# Patient Record
Sex: Male | Born: 1945 | Race: White | Hispanic: Yes | Marital: Married | Smoking: Former smoker
Health system: Southern US, Community
[De-identification: ages and names within clinical notes are randomized; demographics above are authoritative.]

## PROBLEM LIST (undated history)

## (undated) DIAGNOSIS — I480 Paroxysmal atrial fibrillation: Secondary | ICD-10-CM

## (undated) DIAGNOSIS — I4719 Other supraventricular tachycardia: Secondary | ICD-10-CM

## (undated) DIAGNOSIS — I471 Supraventricular tachycardia: Secondary | ICD-10-CM

## (undated) DIAGNOSIS — R001 Bradycardia, unspecified: Secondary | ICD-10-CM

## (undated) DIAGNOSIS — I453 Trifascicular block: Secondary | ICD-10-CM

## (undated) DIAGNOSIS — C61 Malignant neoplasm of prostate: Secondary | ICD-10-CM

## (undated) HISTORY — DX: Trifascicular block: I45.3

## (undated) HISTORY — DX: Bradycardia, unspecified: R00.1

## (undated) HISTORY — DX: Other supraventricular tachycardia: I47.19

## (undated) HISTORY — DX: Supraventricular tachycardia: I47.1

## (undated) HISTORY — PX: DOBUTAMINE STRESS ECHO: SHX5426

## (undated) HISTORY — PX: ELECTROPHYSIOLOGIC STUDY: SHX172A

## (undated) HISTORY — DX: Paroxysmal atrial fibrillation: I48.0

---

## 2006-08-02 ENCOUNTER — Encounter: Payer: Self-pay | Admitting: Internal Medicine

## 2006-08-02 ENCOUNTER — Ambulatory Visit (HOSPITAL_COMMUNITY): Admission: RE | Admit: 2006-08-02 | Discharge: 2006-08-02 | Payer: Self-pay | Admitting: Internal Medicine

## 2007-10-25 ENCOUNTER — Encounter: Payer: Self-pay | Admitting: Internal Medicine

## 2008-11-10 ENCOUNTER — Encounter: Payer: Self-pay | Admitting: Internal Medicine

## 2010-11-11 ENCOUNTER — Ambulatory Visit (HOSPITAL_BASED_OUTPATIENT_CLINIC_OR_DEPARTMENT_OTHER)
Admission: RE | Admit: 2010-11-11 | Discharge: 2010-11-11 | Disposition: A | Discharge status: Home / Self Care | Payer: BC Managed Care – HMO | Source: Ambulatory Visit | Attending: Orthopedic Surgery | Admitting: Orthopedic Surgery

## 2010-11-11 ENCOUNTER — Ambulatory Visit (HOSPITAL_BASED_OUTPATIENT_CLINIC_OR_DEPARTMENT_OTHER): Admit: 2010-11-11 | Payer: Self-pay | Admitting: Orthopedic Surgery

## 2010-11-11 DIAGNOSIS — S42033A Displaced fracture of lateral end of unspecified clavicle, initial encounter for closed fracture: Secondary | ICD-10-CM | POA: Insufficient documentation

## 2010-11-11 DIAGNOSIS — X58XXXA Exposure to other specified factors, initial encounter: Secondary | ICD-10-CM | POA: Insufficient documentation

## 2010-11-17 NOTE — Op Note (Signed)
NAMEWINFIELD, Kevin Castro NO.:  [id number]  MEDICAL RECORD NO.:  [id number]  LOCATION:                                 FACILITY:  PHYSICIAN:  Loreta Ave, M.D. DATE OF BIRTH:  November 15, 1945  DATE OF PROCEDURE:  11/11/2010 DATE OF DISCHARGE:                              OPERATIVE REPORT   PREOPERATIVE DIAGNOSIS:  Left shoulder type 2 distal clavicle fracture with displacement and complete disruption of coracoacromial ligaments.  POSTOPERATIVE DIAGNOSIS:  Left shoulder type 2 distal clavicle fracture with displacement and complete disruption of coracoacromial ligaments. Also with some underlying previously asymptomatic degenerative arthritis, AC joint.  PROCEDURE:  Open intervention left shoulder with repair of shoulder separation utilizing FiberWire #5 x2.  Also a soft tissue allograft and superior metallic button.  Excision distal clavicle at fracture site.  SURGEON:  Loreta Ave, MD  ASSISTANT:  Genene Churn. Barry Dienes, PA present throughout the entire case and necessary for timely completion of procedure.  ANESTHESIA:  General.  BLOOD LOSS:  Minimal.  SPECIMENS:  None.  CULTURES:  None.  COMPLICATION:  None.  DRESSINGS:  Soft compressive with sling.  PROCEDURE:  The patient was brought to operating room, placed on the operating table in supine position.  After adequate anesthesia had been obtained, placed in beach chair position on the shoulder positioner, prepped and draped in usual sterile fashion.  Saber incision over the fracture site down towards coracoid.  Skin and subcutaneous tissue divided.  Fracture medially evident, markedly displaced.  Deltopectoral fascia taken down off front of the clavicle.  Distal fragment exposed. Fluoroscopic guidance throughout.  Distal fragment had no healing at all, was excised in its entirety.  The interval below the clavicle was then explored with blunt dissection exposing the coracoid and the CC ligament  torn off the bottom of the clavicle.  I went carefully under the coracoid bringing two #5 FiberWire sutures as well as a prepared allograft which was tagged with FiberWire at either end.  These were pulled out on either side.  A drill hole was made in the clavicle directly above that with a 5.5 mm drill.  All of the suture and the graft were pulled through the drill hole and then through a round metallic button, it was placed on the top of the clavicle.  FiberWire were brought through the small holes in a button and the graft up through the central larger hole.  Wound irrigated.  I then reduced the separation anatomically and sutured the two #5 FiberWire's on either side of the button reducing and holding this in good anatomic position, confirmed visually and fluoroscopically.  The graft was then tensioned up through the middle of the button and fixed there with a 10 mm interference screw.  Cut off smooth.  Overall construct had excellent stability. Anatomic alignment confirmed visually and fluoroscopically. Wound irrigated.  Deltopectoral fascia closed down over top of the defect.  Incision closed with subcutaneous and subcuticular Vicryl. Sterile compressive dressing applied.  Sling applied.  Anesthesia reversed.  Brought to the recovery room.  Tolerated surgery well.  No complications.     Loreta Ave, M.D.     DFM/MEDQ  D:  11/11/2010  T:  11/12/2010  Job:  [id number]  Electronically Signed by Mckinley Jewel M.D. on 11/17/2010 02:04:37 PM

## 2010-12-20 DIAGNOSIS — I471 Supraventricular tachycardia: Secondary | ICD-10-CM | POA: Insufficient documentation

## 2011-01-14 ENCOUNTER — Encounter (HOSPITAL_BASED_OUTPATIENT_CLINIC_OR_DEPARTMENT_OTHER): Payer: Self-pay

## 2012-04-19 ENCOUNTER — Encounter: Payer: Self-pay | Admitting: Cardiology

## 2012-04-27 ENCOUNTER — Ambulatory Visit: Payer: BC Managed Care – HMO | Admitting: Cardiology

## 2013-06-18 ENCOUNTER — Ambulatory Visit (HOSPITAL_COMMUNITY): Payer: Self-pay

## 2013-06-18 ENCOUNTER — Encounter (HOSPITAL_COMMUNITY): Payer: Self-pay

## 2013-06-18 ENCOUNTER — Ambulatory Visit (HOSPITAL_COMMUNITY)
Admission: RE | Admit: 2013-06-18 | Discharge: 2013-06-18 | Disposition: A | Discharge status: Home / Self Care | Payer: BC Managed Care – PPO | Source: Ambulatory Visit | Attending: Internal Medicine | Admitting: Internal Medicine

## 2013-06-18 VITALS — BP 112/52 | HR 47 | Wt 188.1 lb

## 2013-06-18 DIAGNOSIS — I453 Trifascicular block: Secondary | ICD-10-CM | POA: Insufficient documentation

## 2013-06-18 DIAGNOSIS — R002 Palpitations: Secondary | ICD-10-CM

## 2013-06-18 DIAGNOSIS — I471 Supraventricular tachycardia, unspecified: Secondary | ICD-10-CM | POA: Insufficient documentation

## 2013-06-18 NOTE — Progress Notes (Signed)
Patient ID: Kevin Castro, male   DOB: [birth year], 68 y.o.   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  Denies any significant PMHx.   In 2008 was having exercise-related palpitations and saw Dr. Pauline Aus. Had 24-hour Holter which showed --Rare unifocal PVCs --Frequent atrial ectopic beats with many short bursts of SVT. No sustained events.   Then went to see [name] at [facility] and had EP study. He was non-inducible with isuprel and another agent. He was offered ablation but refused.   November 2011 Echo: EF 59% Mildly dilated LV/RV            AoV sclerosis            Biatrial enlargement (LA 46 mm)            Mild TR/PR  Stress echo 12/2010:           ETT. 16.8 METs (estimated pVO2 = 59)           Short bursts SVT and PVCs           No ischemia  Has been cycling very actively for ~ 13 years. Runs trail marathons during winter. Over past few years doing very well minimal palpitations - on no meds. Last year, was cycling in mountains developed 15 min episode of palpitations - HR spiked up into 180-220 range and sustained. He finally had to get off bike and it abated. Was ok until last Thursday. About 45 mins into ride HR shot up too 210-220 and sustained until he slowed down. Felt dizzy and marked palpitations. Rode 70 miles to [employer] on Saturday and was fine. Sunday had hard ride and had recurrent tachypalpitations on/off 20 mins. No symptoms at rest or with casual activity. Wife says he snores mildly. Drinks 2-3 Diet cokes per day. + tea.    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 No FHx of SCD.    Review of Systems:     Cardiac Review of Systems: {Y] = yes [ ]  = no  Chest Pain [    ]  Resting SOB [   ] Exertional SOB  [  ]  Orthopnea [  ]   Pedal Edema [   ]    Palpitations Blue.Reese   ] Syncope  [  ]   Presyncope [   ]  General Review of Systems: [Y] = yes [  ]=no Constitional: recent weight change [  ]; anorexia [  ]; fatigue [  ]; nausea [  ]; night sweats [  ]; fever [  ]; or chills [  ];  Eye : blurred vision [  ]; diplopia [   ]; vision changes [  ];  Amaurosis fugax[  ]; Resp: cough [  ];  wheezing[  ];  hemoptysis[  ]; shortness of breath[  ]; paroxysmal nocturnal dyspnea[  ]; dyspnea on exertion[  ]; or orthopnea[  ];  GI:  gallstones[  ], vomiting[  ];  dysphagia[  ]; melena[  ];  hematochezia [  ]; heartburn[  ];    GU: kidney stones [  ]; hematuria[  ];   dysuria [  ];  nocturia[  ];  history of     obstruction [  ];                 Skin: rash, swelling[  ];, hair loss[  ];  peripheral edema[  ];  or itching[  ]; Musculosketetal: myalgias[  ];  joint swelling[  ];  joint erythema[  ];  joint pain[  ];  back pain[  ];  Heme/Lymph: bruising[  ];  bleeding[  ];  anemia[  ];  Neuro: TIA[  ];  headaches[  ];  stroke[  ];  vertigo[  ];  seizures[  ];   paresthesias[  ];  difficulty walking[  ];  Psych:depression[  ]; anxiety[  ];  Endocrine: diabetes[  ];  thyroid dysfunction[  ];  Other:    No past medical history on file.  Current Outpatient Prescriptions  Medication Sig Dispense Refill  . Glucosamine-Chondroit-Vit C-Mn (GLUCOSAMINE 1500 COMPLEX) CAPS Take 1,500 each by mouth daily.      . Multiple Vitamins-Minerals (MULTIVITAMIN WITH MINERALS) tablet Take 1 tablet by mouth daily.      . Omega-3 Fatty Acids (FISH OIL) 1000 MG CAPS Take 2,000 mg by mouth.       No current facility-administered medications for this encounter.     Allergies not on file  History   Social History  . Marital Status: Married    Spouse Name: N/A    Number of Children: N/A  . Years of Education: N/A   Occupational History  . Not on file.    Social History Main Topics  . Smoking status: Never Smoker   . Smokeless tobacco: Not on file  . Alcohol Use: Not on file  . Drug Use: Not on file  . Sexual Activity: Not on file   Other Topics Concern  . Not on file   Social History Narrative  . No narrative on file    FHX: Reports family h/o heart arrhythmias but not lethal. No FHx of SCD or premature CAD   PHYSICAL EXAM: Filed Vitals:   06/18/13 1126  BP: 112/52  Pulse: 47   General:  Well appearing. No respiratory difficulty HEENT: normal Neck: supple. no JVD. Carotids 2+ bilat; no bruits. No lymphadenopathy or thryomegaly appreciated. Cor: PMI nondisplaced. Loletha Grayer. regular No rubs, gallops or murmurs. Lungs: clear Abdomen: soft, nontender, nondistended. No hepatosplenomegaly. No bruits or masses. Good bowel sounds. Extremities: no cyanosis, clubbing, rash, edema Neuro: alert & oriented x 3, cranial nerves grossly intact. moves all 4 extremities w/o difficulty. Affect pleasant.   No results found for this or any previous visit (from the past 24 hour(s)). No results found.   ASSESSMENT & PLAN: 1. 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 I also wonder if they may be more associated with early season riding and get better as he gets further  into the season. I discussed this with Dr. Caryl Comes who agrees and brought up the difference between re-entrant and automatic rhythms. We discussed various ways of detection including:  1) Exercise testing 2) Event monitor  3) AliveCor (he only has iPhone 6) 4) Linq monitoring  At this point will wait to see if he has another episode. If so, will refer to Dr. Caryl Comes for [place] implantation. He has not had syncope or near syncope so I feel it is safe to continue cycling. Will check echo.   Shaune Pascal Bensimhon,MD 1:03 PM

## 2013-06-18 NOTE — Addendum Note (Signed)
Encounter addended by: Scarlette Calico, RN on: 06/18/2013  1:08 PM<BR>     Documentation filed: Orders, Patient Instructions Section

## 2013-06-18 NOTE — Patient Instructions (Signed)
Your physician has requested that you have an echocardiogram. Echocardiography is a painless test that uses sound waves to create images of your heart. It provides your doctor with information about the size and shape of your heart and how well your heart's chambers and valves are working. This procedure takes approximately one hour. There are no restrictions for this procedure.   

## 2013-06-25 ENCOUNTER — Ambulatory Visit (HOSPITAL_COMMUNITY)
Admission: RE | Admit: 2013-06-25 | Discharge: 2013-06-25 | Disposition: A | Discharge status: Home / Self Care | Payer: BC Managed Care – PPO | Source: Ambulatory Visit | Attending: Internal Medicine | Admitting: Internal Medicine

## 2013-06-25 DIAGNOSIS — R002 Palpitations: Secondary | ICD-10-CM

## 2013-06-25 DIAGNOSIS — I059 Rheumatic mitral valve disease, unspecified: Secondary | ICD-10-CM

## 2014-01-02 ENCOUNTER — Ambulatory Visit (HOSPITAL_COMMUNITY)
Admission: RE | Admit: 2014-01-02 | Discharge: 2014-01-02 | Disposition: A | Discharge status: Home / Self Care | Payer: BC Managed Care – PPO | Source: Ambulatory Visit | Attending: Cardiology | Admitting: Cardiology

## 2014-01-02 VITALS — BP 122/60 | HR 52 | Wt 188.8 lb

## 2014-01-02 DIAGNOSIS — I471 Supraventricular tachycardia, unspecified: Secondary | ICD-10-CM

## 2014-01-02 DIAGNOSIS — I453 Trifascicular block: Secondary | ICD-10-CM | POA: Diagnosis not present

## 2014-01-02 DIAGNOSIS — I4510 Unspecified right bundle-branch block: Secondary | ICD-10-CM | POA: Diagnosis not present

## 2014-01-02 DIAGNOSIS — R002 Palpitations: Secondary | ICD-10-CM | POA: Diagnosis present

## 2014-01-02 NOTE — Addendum Note (Signed)
Encounter addended by: Kerry Dory, CMA on: 01/02/2014  3:01 PM<BR>     Documentation filed: Orders, Dx Association

## 2014-01-02 NOTE — Addendum Note (Signed)
Encounter addended by: Scarlette Calico, RN on: 01/02/2014  2:18 PM<BR>     Documentation filed: Dx Association, Patient Instructions Section, Orders

## 2014-01-02 NOTE — Progress Notes (Signed)
Patient ID: Kevin Castro, male   DOB: 09-22-1945, 68 y.o.   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  Denies any significant PMHx.   In 2008 was having exercise-related palpitations and saw Dr. Pauline Aus. Had 24-hour Holter which showed --Rare unifocal PVCs --Frequent atrial ectopic beats with many short bursts of SVT. No sustained events.   Then went to see [name] at [facility] and had EP study. He was non-inducible with isuprel and another agent. He was offered ablation but refused.   In 12/14 had prolonged symptomatic episode of tachypalpitations which he did not seek attention for.   November 2011 Echo: EF 59% Mildly dilated LV/RV            AoV sclerosis            Biatrial enlargement (LA 46 mm)            Mild TR/PR  Stress echo 12/2010:            ETT. 16.8 METs (estimated pVO2 = 59)           Short bursts SVT and PVCs           No ischemia  Echo 5/15: EF 55-60% No RWMA  Follow-up: Since I last saw him he had episode of irregular tachypalpitations that lasted for several hours which were quite symptomatic. He went to Urgent Care and nurse took his pulse and confirmed a fast and irregular pulse but by the time he was hooked up for an ECG it converted. Now has AliveCor device. Has not had recurrent episodes but does have one download on AliveCor which suggests an atrial tachycardia or AFL on 11/27 with controlled ventricular rate. Continues to be quite active without recurrent symptoms.    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 No FHx of SCD.     Current Outpatient Prescriptions  Medication Sig Dispense Refill  . Glucosamine-Chondroit-Vit C-Mn (GLUCOSAMINE 1500 COMPLEX) CAPS Take 1,500 each by mouth daily.    . Multiple Vitamins-Minerals  (MULTIVITAMIN WITH MINERALS) tablet Take 1 tablet by mouth daily.    . Omega-3 Fatty Acids (FISH OIL) 1000 MG CAPS Take 2,000 mg by mouth.     No current facility-administered medications for this encounter.     Not on File  History   Social History  . Marital Status: Married    Spouse Name: N/A    Number of Children: N/A  . Years of Education: N/A   Occupational History  . Not on file.   Social History Main Topics  . Smoking status: Never Smoker   . Smokeless tobacco: Not on file  . Alcohol Use: Not on file  . Drug Use: Not on file  . Sexual Activity: Not on file   Other Topics Concern  . Not on file   Social History Narrative  . No narrative on file    FHX: Reports family h/o heart arrhythmias but not lethal. No FHx of SCD or premature CAD   PHYSICAL EXAM: Filed Vitals:   01/02/14 1340  BP: 122/60  Pulse: 52   General:  Well appearing. No respiratory difficulty HEENT: normal Neck: supple. no JVD. Carotids 2+ bilat; no bruits. No lymphadenopathy or thryomegaly appreciated. Cor: PMI nondisplaced. Loletha Grayer. regular No rubs, gallops or  murmurs. Lungs: clear Abdomen: soft, nontender, nondistended. No hepatosplenomegaly. No bruits or masses. Good bowel sounds. Extremities: no cyanosis, clubbing, rash, edema Neuro: alert & oriented x 3, cranial nerves grossly intact. moves all 4 extremities w/o difficulty. Affect pleasant.   No results found for this or any previous visit (from the past 24 hour(s)). No results found.   ASSESSMENT & PLAN: 1. Tachypalpitations - by transmission on his AliveCor this is PSVT - probably atrial tach of AFL though this does not preclude possible episodes of AF. At this point they are infrequent but quite symptomatic. Would continue watchful waiting and if become more frequent would likely need to consider. I will have him see Jolyn Nap for formal EP evaluation and get his opinion as well. Discussed use of vagal maneuvers. No need for  anticoagulation at this time. 2. Trifascicular block 3. Preventive cardiology - he is interested in calcium scoring to help assess cardiac risk. We will schedule this.    Yazeed Pryer,MD 1:19 PM

## 2014-01-02 NOTE — Patient Instructions (Signed)
Calcium Scoring CT scan  You have been referred to Dr Caryl Comes  Follow up as needed

## 2014-01-08 ENCOUNTER — Ambulatory Visit (INDEPENDENT_AMBULATORY_CARE_PROVIDER_SITE_OTHER)
Admission: RE | Admit: 2014-01-08 | Discharge: 2014-01-08 | Disposition: A | Discharge status: Home / Self Care | Payer: Self-pay | Source: Ambulatory Visit | Attending: Internal Medicine | Admitting: Internal Medicine

## 2014-01-08 DIAGNOSIS — I471 Supraventricular tachycardia: Secondary | ICD-10-CM

## 2014-01-29 ENCOUNTER — Encounter: Payer: Self-pay | Admitting: *Deleted

## 2014-01-29 ENCOUNTER — Telehealth: Payer: Self-pay | Admitting: *Deleted

## 2014-01-29 NOTE — Telephone Encounter (Signed)
LVM at [facility] that i was looking for a holter for this pt, no return call... 1st request..

## 2014-02-04 NOTE — Telephone Encounter (Signed)
Spoke to [employer], do not have the 2008 holter.... Pt has alivecore.Marland KitchenMarland Kitchen

## 2014-02-06 ENCOUNTER — Encounter: Payer: Self-pay | Admitting: Internal Medicine

## 2014-02-06 ENCOUNTER — Ambulatory Visit (INDEPENDENT_AMBULATORY_CARE_PROVIDER_SITE_OTHER): Payer: 59 | Admitting: Internal Medicine

## 2014-02-06 VITALS — BP 130/62 | HR 42 | Ht 70.5 in | Wt 183.8 lb

## 2014-02-06 DIAGNOSIS — R002 Palpitations: Secondary | ICD-10-CM

## 2014-02-06 NOTE — Patient Instructions (Signed)
Your physician recommends that you continue on your current medications as directed. Please refer to the Current Medication list given to you today.  Your physician wants you to follow-up in: 6 months with Dr. Klein. You will receive a reminder letter in the mail two months in advance. If you don't receive a letter, please call our office to schedule the follow-up appointment.  

## 2014-02-06 NOTE — Progress Notes (Signed)
ELECTROPHYSIOLOGY CONSULT NOTE  Patient ID: Kevin Castro, MRN: [id number], DOB/AGE: Mar 31, 1945 69 y.o. Admit date: (Not on file) Date of Consult: 02/06/2014  Primary Physician: No primary care provider on file. Primary Cardiologist: db  Chief Complaint: atrial tach   HPI Kevin Castro is a 69 y.o. male  Referred because of a history of atrial tachycardia.  He is an avid bicycle rider and began having spells while at high performance where he be having difficulty breathing associated with palpitations. He was referred to Dr. Rosita Fire at [facility] who identified an atrial tachycardia. EP testing apparently was noninducible. A catheter ablation procedure was discussed but not undertaken. (?).  He has had increasing problems with exercise associated palpitations that he has had 2 episodes that have been associated with syncope/presyncope. The first occurred while at high performance. Heart rate on his monitor went to about 200 bpm and persisted despite decreasing work effort.  He got off his bicycle and passed out  His HR graduallly came down  The other episode had occurred about a year before. He had run in [place] and was in his car driving home when again he had tachycardia and at this time presyncope.    Echocardiogram demonstrated moderate LAE mild RVE and RAE   Past Medical History  Diagnosis Date  . Rapid palpitations     tachy  . Trifascicular block       Surgical History:  Past Surgical History  Procedure Laterality Date  . Electrophysiologic study    . Dobutamine stress echo       Home Meds: Prior to Admission medications   Medication Sig Start Date End Date Taking? Authorizing Provider  Glucosamine-Chondroit-Vit C-Mn (GLUCOSAMINE 1500 COMPLEX) CAPS Take 1,500 each by mouth daily.   Yes Historical Provider, MD  Multiple Vitamins-Minerals (MULTIVITAMIN WITH MINERALS) tablet Take 1 tablet by mouth daily.   Yes Historical Provider, MD  Omega-3 Fatty Acids (FISH  OIL) 1000 MG CAPS Take 2,000 mg by mouth.   Yes Historical Provider, MD       Allergies:  Allergies  Allergen Reactions  . Penicillins     History   Social History  . Marital Status: Married    Spouse Name: N/A    Number of Children: N/A  . Years of Education: N/A   Occupational History  . Not on file.   Social History Main Topics  . Smoking status: Never Smoker   . Smokeless tobacco: Not on file  . Alcohol Use: Not on file  . Drug Use: Not on file  . Sexual Activity: Not on file   Other Topics Concern  . Not on file   Social History Narrative     Family History  Problem Relation Age of Onset  . Arrhythmia Mother   . Lung cancer Father   . Liver disease Father   . Arrhythmia Sister   . Arrhythmia Brother   . Diabetes Brother     oldest brother     ROS:  Please see the history of present illness.     All other systems reviewed and negative.    Physical Exam: Blood pressure 130/62, pulse 42, height 5' 10.5" (1.791 m), weight 183 lb 12.8 oz (83.371 kg). General: Well developed, well nourished male in no acute distress. Head: Normocephalic, atraumatic, sclera non-icteric, no xanthomas, nares are without discharge. EENT: normal Lymph Nodes:  none Back: without scoliosis/kyphosis, no CVA tendersness Neck: Negative for carotid bruits. JVD not elevated. Lungs: Clear bilaterally to auscultation  without wheezes, rales, or rhonchi. Breathing is unlabored. Heart: R but slow RR with S1 S2.  2/6 systolic murmur , rubs, or gallops appreciated. Abdomen: Soft, non-tender, non-distended with normoactive bowel sounds. No hepatomegaly. No rebound/guarding. No obvious abdominal masses. Msk:  Strength and tone appear normal for age. Extremities: No clubbing or cyanosis. No  edema.  Distal pedal pulses are 2+ and equal bilaterally. Skin: Warm and Dry Neuro: Alert and oriented X 3. CN III-XII intact Grossly normal sensory and motor function . Psych:  Responds to questions  appropriately with a normal affect.      Labs: Cardiac Enzymes No results for input(s): CKTOTAL, CKMB, TROPONINI in the last 72 hours. CBC No results found for: WBC, HGB, HCT, MCV, PLT PROTIME: No results for input(s): LABPROT, INR in the last 72 hours. Chemistry No results for input(s): NA, K, CL, CO2, BUN, CREATININE, CALCIUM, PROT, BILITOT, ALKPHOS, ALT, AST, GLUCOSE in the last 168 hours.  Invalid input(s): LABALBU Lipids No results found for: CHOL, HDL, LDLCALC, TRIG BNP No results found for: PROBNP Miscellaneous No results found for: DDIMER  Radiology/Studies:  Ct Cardiac Scoring  01/08/2014   ADDENDUM REPORT: 01/08/2014 17:20  CLINICAL DATA:  Risk stratification  EXAM: Coronary Calcium Score  TECHNIQUE: The patient was scanned on a Siemens Sensation 16 slice scanner. Axial non-contrast 3 mm slices were carried out through the heart. The data set was analyzed on a dedicated work station and scored using the [place].  FINDINGS: Non-cardiac: See separate report from [facility].  Ascending Aorta:  Normal caliber  Pericardium:  Normal, no effusion.  Coronary arteries:  Normal origin.  IMPRESSION: Coronary calcium score of 0. This was 0 percentile for age and sex matched control.  Ena Dawley   Electronically Signed   By: Ena Dawley   On: 01/08/2014 17:20   01/08/2014   EXAM: OVER-READ INTERPRETATION  CT CHEST  The following report is an over-read performed by radiologist Dr. Rebekah Chesterfield [facility] on 01/08/2014. This over-read does not include interpretation of cardiac or coronary anatomy or pathology. The coronary calcium score/coronary CTA interpretation by the cardiologist is attached.  COMPARISON:  No priors.  FINDINGS: Within the visualized portions of the thorax there is no suspicious appearing pulmonary nodule or mass, no acute consolidative airspace disease, no pneumothorax, no pleural effusion and no lymphadenopathy. Visualized portions  of the upper abdomen are unremarkable. There are no aggressive appearing lytic or blastic lesions noted in the visualized portions of the skeleton.  IMPRESSION: 1. No significant incidental noncardiac findings noted.  Electronically Signed: By: Vinnie Langton M.D. On: 01/08/2014 16:49    EKG: Sinus rhythm at 42 Intervals 21/14/50 Axis left at -47  Assessment and Plan:   Trifascicular block  Atrial tachycardia  Syncope/presyncope  Left atrial dilatation and RV chamber enlargement    We discussed treatment options of atrial tachycardia including doing nothing, medication, or catheter ablation although the first procedure was challenging because of the lack of inducibility At this point he would like to continue as he is  The episodes of syncope were associated with persisting tachycardia and he, while concerned, about it is not sufficiently concerned to justify undertaking invasive procedure.  We did discuss the importance of recumbency to mitigate hypotension. I suspect that the tachycardia is activating vasomotor reflexes in the context of exercise and post exercise vasodilatation  We also discussed the implications of protracted exercise potentially be responsible for the chamber enlargement that is seen. I told  him I would discuss with Dr. Reine Just  I suggested we undertake cardiac MR to further quantitate structural abnormalities    Virl Axe

## 2014-08-05 ENCOUNTER — Encounter: Payer: Self-pay | Admitting: Internal Medicine

## 2014-08-05 ENCOUNTER — Ambulatory Visit (INDEPENDENT_AMBULATORY_CARE_PROVIDER_SITE_OTHER): Payer: 59 | Admitting: Internal Medicine

## 2014-08-05 VITALS — BP 116/68 | HR 45 | Ht 70.0 in | Wt 186.8 lb

## 2014-08-05 DIAGNOSIS — I471 Supraventricular tachycardia: Secondary | ICD-10-CM | POA: Diagnosis not present

## 2014-08-05 NOTE — Progress Notes (Signed)
ELECTROPHYSIOLOGY CONSULT NOTE  Patient ID: Kevin Castro, MRN: [id number], DOB/AGE: 1945-11-03 69 y.o. Admit date: (Not on file) Date of Consult: 08/05/2014  Primary Physician: Horatio Pel, MD Primary Cardiologist: db  Chief Complaint: atrial tach   HPI Kevin Castro is a 69 y.o. male  Referred because of a history of atrial tachycardia.  He had recurrent event HR 120 x 24 hrs and using alivecor>>afib     Past Medical History  Diagnosis Date  . Rapid palpitations     tachy  . Trifascicular block       Surgical History:  Past Surgical History  Procedure Laterality Date  . Electrophysiologic study    . Dobutamine stress echo       Home Meds: Prior to Admission medications   Medication Sig Start Date End Date Taking? Authorizing Provider  Glucosamine-Chondroit-Vit C-Mn (GLUCOSAMINE 1500 COMPLEX) CAPS Take 1,500 each by mouth daily.   Yes Historical Provider, MD  Multiple Vitamins-Minerals (MULTIVITAMIN WITH MINERALS) tablet Take 1 tablet by mouth daily.   Yes Historical Provider, MD  Omega-3 Fatty Acids (FISH OIL) 1000 MG CAPS Take 2,000 mg by mouth.   Yes Historical Provider, MD       Allergies:  Allergies  Allergen Reactions  . Penicillins Rash    Rash/blisters    History   Social History  . Marital Status: Married    Spouse Name: N/A  . Number of Children: N/A  . Years of Education: N/A   Occupational History  . Not on file.   Social History Main Topics  . Smoking status: Never Smoker   . Smokeless tobacco: Not on file  . Alcohol Use: Not on file  . Drug Use: Not on file  . Sexual Activity: Not on file   Other Topics Concern  . Not on file   Social History Narrative     Family History  Problem Relation Age of Onset  . Arrhythmia Mother   . Lung cancer Father   . Liver disease Father   . Arrhythmia Sister   . Arrhythmia Brother   . Diabetes Brother     oldest brother     ROS:  Please see the history of present illness.      All other systems reviewed and negative.    Physical Exam: Blood pressure 116/68, pulse 45, height 5\' 10"  (1.778 m), weight 186 lb 12.8 oz (84.732 kg). General: Well developed, well nourished male in no acute distress. Head: Normocephalic, atraumatic, sclera non-icteric, no xanthomas, nares are without discharge. EENT: normal   Back: without scoliosis/kyphosis, no CVA tendersness Neck: Negative for carotid bruits. JVD not elevated. L  Heart: R but slow RR     Msk:  Strength and tone appear normal for age. Extremities: No clubbing or cyanosis. No  edema.  Distal pedal pulses are 2+ and equal bilaterally. Skin: Warm and Dry Neuro: Alert and oriented X 3. CN III-XII intact Grossly normal sensory and motor function . Psych:  Responds to questions appropriately with a normal affect.      Labs: Cardiac Enzymes No results for input(s): CKTOTAL, CKMB, TROPONINI in the last 72 hours. CBC No results found for: WBC, HGB, HCT, MCV, PLT PROTIME: No results for input(s): LABPROT, INR in the last 72 hours. Chemistry No results for input(s): NA, K, CL, CO2, BUN, CREATININE, CALCIUM, PROT, BILITOT, ALKPHOS, ALT, AST, GLUCOSE in the last 168 hours.  Invalid input(s): LABALBU Lipids No results found for: CHOL, HDL, LDLCALC, TRIG BNP No results  found for: PROBNP Miscellaneous No results found for: DDIMER  Radiology/Studies:  No results found.  EKG: Sinus rhythm at 42 Intervals 21/14/50 Axis left at -47  Assessment and Plan:   Trifascicular block  Atrial tachycardia  Atrial fibrillation  Sinus bradycardia  Syncope/presyncope  Left atrial dilatation and RV chamber enlargement  The ALiveCor demonstrated Afib with RVR   We discussed the possibility of catheter ablation and he would like to discuss with ablationist as primary therapy so as to allow him to continue to exercise  We discussed anticoagulation and with CHADSVASC 1 for age i am borderline in favor of recommending  NOAC, but as he is rider would hold off. Would not use ASA He understands would need anticoagulation for PVI  We spent more than 50% of our >25 min visit in face to face counseling regarding the above        Virl Axe

## 2014-08-05 NOTE — Patient Instructions (Signed)
Medication Instructions:  Your physician recommends that you continue on your current medications as directed. Please refer to the Current Medication list given to you today.  Labwork: No new orders.   Testing/Procedures: No new orders.   Follow-Up: Your physician recommends that you schedule a follow-up appointment as needed with Dr Caryl Comes.    Any Other Special Instructions Will Be Listed Below (If Applicable).

## 2014-10-06 ENCOUNTER — Encounter (HOSPITAL_COMMUNITY): Payer: Self-pay | Admitting: Emergency Medicine

## 2014-10-06 ENCOUNTER — Emergency Department (HOSPITAL_COMMUNITY): Payer: 59

## 2014-10-06 ENCOUNTER — Emergency Department (HOSPITAL_COMMUNITY)
Admission: EM | Admit: 2014-10-06 | Discharge: 2014-10-06 | Disposition: A | Discharge status: Home / Self Care | Payer: 59 | Attending: Emergency Medicine | Admitting: Emergency Medicine

## 2014-10-06 DIAGNOSIS — S3991XA Unspecified injury of abdomen, initial encounter: Secondary | ICD-10-CM | POA: Diagnosis not present

## 2014-10-06 DIAGNOSIS — S70312A Abrasion, left thigh, initial encounter: Secondary | ICD-10-CM | POA: Diagnosis not present

## 2014-10-06 DIAGNOSIS — Y9389 Activity, other specified: Secondary | ICD-10-CM | POA: Insufficient documentation

## 2014-10-06 DIAGNOSIS — Y92410 Unspecified street and highway as the place of occurrence of the external cause: Secondary | ICD-10-CM | POA: Insufficient documentation

## 2014-10-06 DIAGNOSIS — T148XXA Other injury of unspecified body region, initial encounter: Secondary | ICD-10-CM

## 2014-10-06 DIAGNOSIS — S40812A Abrasion of left upper arm, initial encounter: Secondary | ICD-10-CM | POA: Insufficient documentation

## 2014-10-06 DIAGNOSIS — S2232XA Fracture of one rib, left side, initial encounter for closed fracture: Secondary | ICD-10-CM | POA: Diagnosis not present

## 2014-10-06 DIAGNOSIS — Z880 Allergy status to penicillin: Secondary | ICD-10-CM | POA: Insufficient documentation

## 2014-10-06 DIAGNOSIS — S80212A Abrasion, left knee, initial encounter: Secondary | ICD-10-CM | POA: Diagnosis not present

## 2014-10-06 DIAGNOSIS — Y998 Other external cause status: Secondary | ICD-10-CM | POA: Diagnosis not present

## 2014-10-06 DIAGNOSIS — Z79899 Other long term (current) drug therapy: Secondary | ICD-10-CM | POA: Diagnosis not present

## 2014-10-06 DIAGNOSIS — S29001A Unspecified injury of muscle and tendon of front wall of thorax, initial encounter: Secondary | ICD-10-CM | POA: Diagnosis present

## 2014-10-06 MED ORDER — HYDROCODONE-ACETAMINOPHEN 5-325 MG PO TABS: 1.0000 | ORAL_TABLET | ORAL | Status: DC | PRN

## 2014-10-06 MED ORDER — HYDROCODONE-ACETAMINOPHEN 5-325 MG PO TABS
1.0000 | ORAL_TABLET | Freq: Once | ORAL | Status: AC
  Administered 2014-10-06: 1 via ORAL
  Filled 2014-10-06: qty 1

## 2014-10-06 MED ORDER — HYDROMORPHONE HCL 1 MG/ML IJ SOLN
1.0000 mg | Freq: Once | INTRAMUSCULAR | Status: AC
  Administered 2014-10-06: 1 mg via INTRAMUSCULAR
  Filled 2014-10-06: qty 1

## 2014-10-06 MED ORDER — IBUPROFEN 400 MG PO TABS
600.0000 mg | ORAL_TABLET | Freq: Once | ORAL | Status: AC
  Administered 2014-10-06: 600 mg via ORAL
  Filled 2014-10-06 (×2): qty 1

## 2014-10-06 NOTE — ED Notes (Signed)
Rn cleansed and dressed pt's wounds/ applied bacitracin and nonadherent dressings. Pt tolerated well.
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
## 2014-10-06 NOTE — Discharge Instructions (Signed)
Keep wounds clean, apply topical antibodies once daily for the next few days. Watch for signs of infection such as spreading redness, pus draining, fevers or chills. For severe pain take norco or vicodin however realize they have the potential for addiction and it can make you sleepy and has tylenol in it.  No operating machinery while taking.  If you were given medicines take as directed.  If you are on coumadin or contraceptives realize their levels and effectiveness is altered by many different medicines.  If you have any reaction (rash, tongues swelling, other) to the medicines stop taking and see a physician.    If your blood pressure was elevated in the ER make sure you follow up for management with a primary doctor or return for chest pain, shortness of breath or stroke symptoms.  Please follow up as directed and return to the ER or see a physician for new or worsening symptoms.  Thank you. Filed Vitals:   10/06/14 1236  Temp: 98.4 F (36.9 C)  TempSrc: Oral  Height: 5\' 10"  (1.778 m)  Weight: 182 lb (82.555 kg)

## 2014-10-07 ENCOUNTER — Telehealth: Payer: Self-pay | Admitting: *Deleted

## 2014-10-07 NOTE — ED Notes (Signed)
Patient called stating he received a call from the ER today however I am unable to find documentation of this.  Patient states that he is feeling OK at this time, advised to return if any new problems.

## 2015-06-26 ENCOUNTER — Ambulatory Visit (INDEPENDENT_AMBULATORY_CARE_PROVIDER_SITE_OTHER): Payer: 59 | Admitting: Nurse Practitioner

## 2015-06-26 ENCOUNTER — Encounter: Payer: Self-pay | Admitting: Nurse Practitioner

## 2015-06-26 ENCOUNTER — Ambulatory Visit: Payer: 59 | Admitting: Physician Assistant

## 2015-06-26 VITALS — BP 130/70 | HR 80 | Ht 70.0 in | Wt 187.0 lb

## 2015-06-26 DIAGNOSIS — R002 Palpitations: Secondary | ICD-10-CM

## 2015-06-26 DIAGNOSIS — I471 Supraventricular tachycardia: Secondary | ICD-10-CM

## 2015-06-26 DIAGNOSIS — I480 Paroxysmal atrial fibrillation: Secondary | ICD-10-CM

## 2015-06-26 DIAGNOSIS — I4891 Unspecified atrial fibrillation: Secondary | ICD-10-CM

## 2015-06-26 NOTE — Patient Instructions (Signed)
Medication Instructions:   Your physician recommends that you continue on your current medications as directed. Please refer to the Current Medication list given to you today.   If you need a refill on your cardiac medications before your next appointment, please call your pharmacy.  Labwork: NONE ORDER TODAY    Testing/Procedures: Your physician has requested that you have an echocardiogram. Echocardiography is a painless test that uses sound waves to create images of your heart. It provides your doctor with information about the size and shape of your heart and how well your heart's chambers and valves are working. This procedure takes approximately one hour. There are no restrictions for this procedure.   Follow-Up:  WITH DR CAMNITZ OR DR Rayann Heman FOR AN AFIB ABLATION PER SEILER NEXT AVAILABLE APPT SLOT    Any Other Special Instructions Will Be Listed Below (If Applicable).

## 2015-06-26 NOTE — Progress Notes (Signed)
Electrophysiology Office Note Date: 06/26/2015  ID:  Kevin Castro, DOB 11-11-1945, MRN [id number]  PCP: Horatio Pel, MD Primary Cardiologist: [place] Electrophysiologist: Caryl Comes  CC: palpitations and fatigue  Kevin Castro is a 70 y.o. male seen today as a walk in for Dr Caryl Comes.  He was last seen by Dr Caryl Comes in July of 2016.  At that time, referral to Dr Rayann Heman was offered to discuss ablation but the patient wanted to defer.  He has had few episodes of AF since that time and most have lasted only minutes and then self terminated.  He went into AF last night which has persisted. He is symptomatic with shortness of breath and fatigue.  At baseline, he does not have functional limitations and exercises routinely.  He is very aware of his heart rate and is very clear that he went into AF last night.  He denies chest pain, PND, orthopnea, nausea, vomiting, dizziness, syncope, edema, weight gain, or early satiety.  Echo 05/2013 demonstrated EF 55-60%, no RWMA, mild MR, LA 41.   Past Medical History  Diagnosis Date  . Paroxysmal atrial fibrillation (HCC)   . Trifascicular block   . Sinus bradycardia   . Atrial tachycardia [facility])    Past Surgical History  Procedure Laterality Date  . Electrophysiologic study    . Dobutamine stress echo      Current Outpatient Prescriptions  Medication Sig Dispense Refill  . acetaminophen (TYLENOL) 325 MG tablet Take 650 mg by mouth daily as needed. For pain    . Glucosamine-Chondroit-Vit C-Mn (GLUCOSAMINE 1500 COMPLEX) CAPS Take 1,500 each by mouth daily.    . Multiple Vitamins-Minerals (MULTIVITAMIN WITH MINERALS) tablet Take 1 tablet by mouth daily.    . Omega-3 Fatty Acids (FISH OIL) 1000 MG CAPS Take 2,000 mg by mouth daily.      No current facility-administered medications for this visit.    Allergies:   Penicillins   Social History: Social History   Social History  . Marital Status: Married    Spouse Name: N/A  . Number of  Children: N/A  . Years of Education: N/A   Occupational History  . Not on file.   Social History Main Topics  . Smoking status: Never Smoker   . Smokeless tobacco: Not on file  . Alcohol Use: 7.2 oz/week    12 Cans of beer per week     Comment: "can go through a couple six packs on the weekend"  . Drug Use: Not on file  . Sexual Activity: Not on file   Other Topics Concern  . Not on file   Social History Narrative    Family History: Family History  Problem Relation Age of Onset  . Arrhythmia Mother   . Lung cancer Father   . Liver disease Father   . Arrhythmia Sister   . Arrhythmia Brother   . Diabetes Brother     oldest brother  . Heart attack Neg Hx   . Hypertension Neg Hx   . Stroke Mother     89    Review of Systems: All other systems reviewed and are otherwise negative except as noted above.   Physical Exam: VS:  BP 130/70 mmHg  Pulse 80  Ht 5\' 10"  (1.778 m)  Wt 187 lb (84.823 kg)  BMI 26.83 kg/m2 , BMI Body mass index is 26.83 kg/(m^2). Wt Readings from Last 3 Encounters:  06/26/15 187 lb (84.823 kg)  10/06/14 182 lb (82.555 kg)  08/05/14 186  lb 12.8 oz (84.732 kg)    GEN- The patient is well appearing, alert and oriented x 3 today.   HEENT: normocephalic, atraumatic; sclera clear, conjunctiva pink; hearing intact; oropharynx clear; neck supple Lungs- Clear to ausculation bilaterally, normal work of breathing.  No wheezes, rales, rhonchi Heart- Bradycardic irregular rate and rhythm, no murmurs, rubs or gallops, PMI not laterally displaced GI- soft, non-tender, non-distended, bowel sounds present, no hepatosplenomegaly Extremities- no clubbing, cyanosis, or edema; DP/PT/radial pulses 2+ bilaterally MS- no significant deformity or atrophy Skin- warm and dry, no rash or lesion  Psych- euthymic mood, full affect Neuro- strength and sensation are intact   EKG:  EKG is ordered today. The ekg ordered today shows atrial fibrillation, rate 66, RBBB,  LAFB  Recent Labs: No results found for requested labs within last 365 days.    Other studies Reviewed: Additional studies/ records that were reviewed today include: Dr Olin Pia office notes, echo   Assessment and Plan: 1.  Paroxysmal atrial fibrillation The patient has had recurrent symptomatic paroxysmal atrial fibrillation.  He is symptomatic with fatigue and shortness of breath.  He is very clear that this episode started last night.  I have offered him early cardioversion today as we are in the 48 hour window.  He declines.   AAD options are severely limited with baseline bradycardia and underlying conduction system disease.  After a discussion with he and his wife, he would like to continue current strategy for now and see if he converts on his own.  I have told him that he could go to the ER tomorrow morning if necessary for DCCV and not to eat or drink anything after midnight tonight if that was his preference CHADS2VASC is 1 - I discussed with him that guidelines would suggest no anticoagulation, daily ASA, or OAC are appropriate.  He would like to continue off of anticoagulation for now.  He is aware that if AF persists into next week will need to start [facility] in order to restore SR.  He would like to discuss RFCA for AF. Will refer to Dr Rayann Heman or Dr Curt Bears  Update echo    Current medicines are reviewed at length with the patient today.   The patient does not have concerns regarding his medicines.  The following changes were made today:  none  Labs/ tests ordered today include:  Orders Placed This Encounter  Procedures  . EKG 12-Lead  . ECHOCARDIOGRAM COMPLETE     Disposition:   Follow up with Dr Rayann Heman or Dr Curt Bears to discuss ablation    Signed, Chanetta Marshall, NP 06/26/2015 9:05 AM   [place] [address] [place] [address] [phone] (office) [phone] (fax)

## 2015-07-13 ENCOUNTER — Other Ambulatory Visit: Payer: Self-pay

## 2015-07-13 ENCOUNTER — Ambulatory Visit (HOSPITAL_COMMUNITY): Payer: 59 | Attending: Cardiovascular Disease

## 2015-07-13 DIAGNOSIS — I4891 Unspecified atrial fibrillation: Secondary | ICD-10-CM | POA: Diagnosis not present

## 2015-07-13 DIAGNOSIS — I340 Nonrheumatic mitral (valve) insufficiency: Secondary | ICD-10-CM | POA: Insufficient documentation

## 2015-07-13 DIAGNOSIS — I517 Cardiomegaly: Secondary | ICD-10-CM | POA: Insufficient documentation

## 2015-07-13 LAB — ECHOCARDIOGRAM COMPLETE
CHL CUP MV DEC (S): 317
CHL CUP TV REG PEAK VELOCITY: 237 cm/s
E/e' ratio: 4.82
EWDT: 317 ms
FS: 38 % (ref 28–44)
IVS/LV PW RATIO, ED: 0.93
LA ID, A-P, ES: 40 mm
LA diam end sys: 40 mm
LA diam index: 1.97 cm/m2
LA vol A4C: 71 ml
LA vol index: 43.3 mL/m2
LA vol: 88 mL
LDCA: 3.8 cm2
LV E/e' medial: 4.82
LV PW d: 11.7 mm — AB (ref 0.6–1.1)
LV e' LATERAL: 12 cm/s
LVEEAVG: 4.82
LVOT VTI: 28.2 cm
LVOT peak vel: 98.5 cm/s
LVOTD: 22 mm
LVOTSV: 107 mL
MV pk A vel: 40.5 m/s
MV pk E vel: 57.8 m/s
TDI e' lateral: 12
TDI e' medial: 8.11
TRMAXVEL: 237 cm/s

## 2015-07-14 ENCOUNTER — Institutional Professional Consult (permissible substitution): Payer: 59 | Admitting: Cardiology

## 2015-07-17 ENCOUNTER — Telehealth: Payer: Self-pay | Admitting: *Deleted

## 2015-07-17 NOTE — Telephone Encounter (Signed)
-----   Message from Kevin Berthold, NP sent at 07/14/2015  8:03 AM EDT ----- Please notify patient of echo results.  Normal heart pumping function, no significant valve disease.  Keep appt with Dr Rayann Heman to discuss ablation.

## 2015-07-20 ENCOUNTER — Encounter: Payer: Self-pay | Admitting: Internal Medicine

## 2015-07-20 ENCOUNTER — Ambulatory Visit (INDEPENDENT_AMBULATORY_CARE_PROVIDER_SITE_OTHER): Payer: 59 | Admitting: Internal Medicine

## 2015-07-20 VITALS — BP 120/70 | HR 54 | Ht 70.5 in | Wt 191.4 lb

## 2015-07-20 DIAGNOSIS — I453 Trifascicular block: Secondary | ICD-10-CM | POA: Diagnosis not present

## 2015-07-20 DIAGNOSIS — I480 Paroxysmal atrial fibrillation: Secondary | ICD-10-CM | POA: Diagnosis not present

## 2015-07-20 NOTE — Patient Instructions (Addendum)
Medication Instructions:  Your physician recommends that you continue on your current medications as directed. Please refer to the Current Medication list given to you today.   Labwork: None ordered   Testing/Procedures: Your physician has recommended that you have an ablation. Catheter ablation is a medical procedure used to treat some cardiac arrhythmias (irregular heartbeats). During catheter ablation, a long, thin, flexible tube is put into a blood vessel in your groin (upper thigh), or neck. This tube is called an ablation catheter. It is then guided to your heart through the blood vessel. Radio frequency waves destroy small areas of heart tissue where abnormal heartbeats may cause an arrhythmia to start. Please see the instruction sheet given to you today.  Your physician has requested that you have cardiac CT. Cardiac computed tomography (CT) is a painless test that uses an x-ray machine to take clear, detailed pictures of your heart. For further information please visit [facility]. Please follow instruction sheet as given.---this will be done week prior to the ablation     Will do in Sept: Dates to look at are 9/5,9/12, 9/15, 9/22,9/26, 9/28  Call back and let me know the date you would like to schedule  Janan Halter, RN  Follow-Up: Your physician recommends that you schedule a follow-up appointment in: August with Dr Rayann Heman   Any Other Special Instructions Will Be Listed Below (If Applicable).     If you need a refill on your cardiac medications before your next appointment, please call your pharmacy.

## 2015-07-20 NOTE — Progress Notes (Signed)
Electrophysiology Office Note   Date:  07/20/2015   ID:  Kevin Castro, DOB 06/04/1945, MRN [id number]  PCP:  Horatio Pel, MD  Primary Electrophysiologist: Dr Caryl Comes  Chief Complaint  Patient presents with  . Atrial Fibrillation     History of Present Illness: Kevin Castro is a 70 y.o. male who presents today for electrophysiology evaluation.   The patient is referred for consideration of ablation.  He has had afib/ atach for about 10 years.  He was initially seen by Dr Rosita Fire at [facility].  He had an isuprel infusion without inducible atach at that time.  He has never had ablation. He has been seen by Dr Caryl Comes for the past few years.  He has been diagnosed with atrial fibrillation.  He also has RBBB/LAHB.  He has not tried AADs due to conduction system disease.  He remains very active and continues to exercise regularly without limitation.  AF occurs about 2-3 times per year.  Most recently he had an episode of AF with tachypalpitations and transient syncope.  He presented to our office and was evaluated by Chanetta Marshall.  EKG revealed AF.   Though previous episodes of afib occurred with exercise, this particular episode occurred at rest. Today, he denies symptoms of palpitations, chest pain, shortness of breath, orthopnea, PND, lower extremity edema, claudication, dizziness, presyncope, syncope, bleeding, or neurologic sequela. The patient is tolerating medications without difficulties and is otherwise without complaint today.    Past Medical History  Diagnosis Date  . Paroxysmal atrial fibrillation (HCC)   . Trifascicular block   . Sinus bradycardia   . Atrial tachycardia [facility])    Past Surgical History  Procedure Laterality Date  . Electrophysiologic study    . Dobutamine stress echo       Current Outpatient Prescriptions  Medication Sig Dispense Refill  . acetaminophen (TYLENOL) 325 MG tablet Take 650 mg by mouth daily as needed. For pain    .  Glucosamine-Chondroit-Vit C-Mn (GLUCOSAMINE 1500 COMPLEX) CAPS Take 1 capsule by mouth daily.     . Multiple Vitamins-Minerals (MULTIVITAMIN WITH MINERALS) tablet Take 1 tablet by mouth daily.    . Omega-3 Fatty Acids (FISH OIL) 1000 MG CAPS Take 2,000 mg by mouth daily.      No current facility-administered medications for this visit.    Allergies:   Penicillins   Social History:  The patient  reports that he has never smoked. He does not have any smokeless tobacco history on file. He reports that he drinks about 7.2 oz of alcohol per week.   Family History:  The patient's  family history includes Arrhythmia in his brother, mother, and sister; Diabetes in his brother; Liver disease in his father; Lung cancer in his father; Stroke in his mother. There is no history of Heart attack or Hypertension.    ROS:  Please see the history of present illness.   All other systems are reviewed and negative.    PHYSICAL EXAM: VS:  BP 120/70 mmHg  Pulse 54  Ht 5' 10.5" (1.791 m)  Wt 191 lb 6.4 oz (86.818 kg)  BMI 27.07 kg/m2 , BMI Body mass index is 27.07 kg/(m^2). GEN: Well nourished, well developed, in no acute distress HEENT: normal Neck: no JVD, carotid bruits, or masses Cardiac: RRR; no murmurs, rubs, or gallops,no edema  Respiratory:  clear to auscultation bilaterally, normal work of breathing GI: soft, nontender, nondistended, + BS MS: no deformity or atrophy Skin: warm and dry  Neuro:  Strength  and sensation are intact Psych: euthymic mood, full affect  EKG:  Baseline ekg reveals sinus with RBBB/LAHB,  Recent ekg confirms afib  Wt Readings from Last 3 Encounters:  07/20/15 191 lb 6.4 oz (86.818 kg)  06/26/15 187 lb (84.823 kg)  10/06/14 182 lb (82.555 kg)      Other studies Reviewed: Additional studies/ records that were reviewed today include:  Dr Aquilla Hacker notes, Medical illustrator notes, echo  Review of the above records today demonstrates: preserved EF, mild LA  enlargement   ASSESSMENT AND PLAN:  1.  Paroxysmal atrial fibrillation The patient has symptomatic recurrent atrial fibrillation.  Given conduction system disease, I agree that we should avoid AADs as this would increase risks of AV block.  Therapeutic strategies for afib including medicine and ablation were discussed in detail with the patient today. Risk, benefits, and alternatives to EP study and radiofrequency ablation for afib were also discussed in detail today. These risks include but are not limited to stroke, bleeding, vascular damage, tamponade, perforation, damage to the esophagus, lungs, and other structures, pulmonary vein stenosis, worsening renal function, AV block requiring PPM, and death. The patient understands these risk and wishes to proceed.   He wants to wait until September.  He is aware that he will need initiation of xarelto 3 weeks prior to ablation and also cardiac CT prior to the procedure (within 1 week of procedure).  I have instructed him to return to see me in early August for arrangement and further discussion.  2. Trifascicular block Avoid AV nodal agents I did discuss pacemaker implantation as a potential down stream evolution of his conduction system disease.  Given recent syncope, PPM implantation at this time is justified.  He is very clear that he is not interested in PPM implantation at this time.  Today, I have spent 40 minutes with the patient discussing afib .  More than 50% of the visit time today was spent on this issue.    Follow-up:  Return in August  Current medicines are reviewed at length with the patient today.   The patient does not have concerns regarding his medicines.  The following changes were made today:  none   Signed, Thompson Grayer, MD  07/20/2015 11:15 AM     [facility] [address] [place] [address] [phone] (office) [phone] (fax)

## 2015-08-25 ENCOUNTER — Telehealth: Payer: Self-pay | Admitting: Internal Medicine

## 2015-08-25 DIAGNOSIS — I480 Paroxysmal atrial fibrillation: Secondary | ICD-10-CM

## 2015-08-25 NOTE — Telephone Encounter (Signed)
New Message:    Pt wants to know when is he suppose to have his CT Scan?

## 2015-08-25 NOTE — Telephone Encounter (Signed)
Spoke with pt and informed him that CT scan will be scheduled once ablation is scheduled.  Pt states ablation is suppose to be scheduled for 9/15.  Advised pt that I would route this message to Dr. Jackalyn Lombard nurse and have her follow up with pt once ablation is scheduled.  Pt verbalized understanding and was in agreement with this plan.

## 2015-08-25 NOTE — Telephone Encounter (Signed)
As noted below the pt was advised by Dr Rayann Heman and Claiborne Billings RN, to pick a date to have his ablation scheduled.   Pt has picked the date 9/15 to have his ablation scheduled.   Informed the pt that I will pass this message along to [facility], so she can follow-up with him and schedule this procedure.  Informed the pt that Claiborne Billings will then go over all instructions needed to prepare for this procedure.   Informed the pt that Claiborne Billings is out of the office today.  Pt verbalized understanding and agrees with this plan.        Testing/Procedures: Your physician has recommended that you have an ablation. Catheter ablation is a medical procedure used to treat some cardiac arrhythmias (irregular heartbeats). During catheter ablation, a long, thin, flexible tube is put into a blood vessel in your groin (upper thigh), or neck. This tube is called an ablation catheter. It is then guided to your heart through the blood vessel. Radio frequency waves destroy small areas of heart tissue where abnormal heartbeats may cause an arrhythmia to start. Please see the instruction sheet given to you today.  Your physician has requested that you have cardiac CT. Cardiac computed tomography (CT) is a painless test that uses an x-ray machine to take clear, detailed pictures of your heart. For further information please visit [facility]. Please follow instruction sheet as given.---this will be done week prior to the ablation     Will do in Sept: Dates to look at are 9/5,9/12, 9/15, 9/22,9/26, 9/28  Call back and let me know the date you would like to schedule  Janan Halter, RN

## 2015-08-28 MED ORDER — RIVAROXABAN 20 MG PO TABS
20.0000 mg | ORAL_TABLET | Freq: Every day | ORAL | 11 refills | Status: DC
Start: 2015-09-21 — End: 2015-11-18

## 2015-08-28 NOTE — Telephone Encounter (Addendum)
Per Dr Jackalyn Lombard June note:  He is aware that he will need initiation of xarelto 3 weeks prior to ablation and also cardiac CT prior to the procedure (within 1 week of procedure).  I have instructed him to return to see me in early August for arrangement and further discussion.  Will call in Xarelto 20 mg  for him to start on 8/21---which will give him 3 weeks prior to the ablation  Ablation is scheduled for 10/16/15 at 7:30am.  Will need to be at the hospital at 5:30am.  NPO after midnight the night prior to the ablation.  Will need labs on 10/02/15, does not need to fast.  Order for CT is in for the week prior to the ablation.  Ivin Booty will call patient and schedule.He has an appointment  Aug 24 to have him come back to discuss

## 2015-09-01 ENCOUNTER — Encounter: Payer: Self-pay | Admitting: Internal Medicine

## 2015-09-02 ENCOUNTER — Ambulatory Visit: Payer: 59 | Admitting: Internal Medicine

## 2015-09-02 ENCOUNTER — Telehealth: Payer: Self-pay | Admitting: Internal Medicine

## 2015-09-02 NOTE — Telephone Encounter (Signed)
Records received from [facility] in chart prep room

## 2015-09-09 ENCOUNTER — Encounter: Payer: Self-pay | Admitting: Internal Medicine

## 2015-09-24 ENCOUNTER — Encounter: Payer: Self-pay | Admitting: Internal Medicine

## 2015-09-24 ENCOUNTER — Ambulatory Visit (INDEPENDENT_AMBULATORY_CARE_PROVIDER_SITE_OTHER): Payer: 59 | Admitting: Internal Medicine

## 2015-09-24 VITALS — BP 100/62 | HR 45 | Ht 70.5 in | Wt 173.4 lb

## 2015-09-24 DIAGNOSIS — I480 Paroxysmal atrial fibrillation: Secondary | ICD-10-CM | POA: Diagnosis not present

## 2015-09-24 DIAGNOSIS — I453 Trifascicular block: Secondary | ICD-10-CM

## 2015-09-24 NOTE — Patient Instructions (Signed)
Medication Instructions:  Your physician recommends that you continue on your current medications as directed. Please refer to the Current Medication list given to you today.   Labwork: None ordered   Testing/Procedures: Your physician has recommended that you have an ablation. Catheter ablation is a medical procedure used to treat some cardiac arrhythmias (irregular heartbeats). During catheter ablation, a long, thin, flexible tube is put into a blood vessel in your groin (upper thigh), or neck. This tube is called an ablation catheter. It is then guided to your heart through the blood vessel. Radio frequency waves destroy small areas of heart tissue where abnormal heartbeats may cause an arrhythmia to start. Please see the instruction sheet given to you today.---10/16/15  Please check in at [facility] at 5:30am Do not eat or drink after midnight the night before your procedure Do not take any medications the morning of the procedure  Your physician has requested that you have cardiac CT. Cardiac computed tomography (CT) is a painless test that uses an x-ray machine to take clear, detailed pictures of your heart. For further information please visit [facility]. Please follow instruction sheet as given.---10/09/15     Follow-Up: Your physician recommends that you schedule a follow-up appointment in: 4 weeks from 10/16/15 with Roderic Palau, NP and 3 months from 10/16/15 wit Dr Rayann Heman   Any Other Special Instructions Will Be Listed Below (If Applicable).     If you need a refill on your cardiac medications before your next appointment, please call your pharmacy.

## 2015-09-30 NOTE — Progress Notes (Signed)
Electrophysiology Office Note   Date:  09/30/2015   ID:  Kevin Castro, DOB [birth year], MRN [id number]  PCP:  Horatio Pel, MD  Primary Electrophysiologist: Dr Caryl Comes  Chief Complaint  Patient presents with  . Atrial Fibrillation     History of Present Illness: Kevin Castro is a 70 y.o. male who presents today for electrophysiology follow-up.   The patient is referred for consideration of ablation.  He has had afib/ atach for about 10 years.  He was initially seen by Dr Rosita Fire at [facility]  He had an isuprel infusion without inducible atach at that time.  He has never had ablation. He has been seen by Dr Caryl Comes for the past few years.  He has been diagnosed with atrial fibrillation.  He also has RBBB/LAHB.  He has not tried AADs due to conduction system disease.  He remains very active and continues to exercise regularly without limitation.  AF occurs about 2-3 times per year.  Most recently he had an episode of AF with tachypalpitations and transient syncope.  EKG at that time revealed AF.   Though previous episodes of afib occurred with exercise, this particular episode occurred at rest.  He has done well since I saw him last.  He now wishes to consider ablation. Today, he denies symptoms of palpitations, chest pain, shortness of breath, orthopnea, PND, lower extremity edema, claudication, dizziness, presyncope, syncope, bleeding, or neurologic sequela. The patient is tolerating medications without difficulties and is otherwise without complaint today.    Past Medical History:  Diagnosis Date  . Atrial tachycardia ([place])   . Paroxysmal atrial fibrillation (HCC)   . Sinus bradycardia   . Trifascicular block    Past Surgical History:  Procedure Laterality Date  . DOBUTAMINE STRESS ECHO    . ELECTROPHYSIOLOGIC STUDY       Current Outpatient Prescriptions  Medication Sig Dispense Refill  . acetaminophen (TYLENOL) 325 MG tablet Take 650 mg by mouth daily as needed (pain).  Rarely takes    . Glucosamine-Chondroit-Vit C-Mn (GLUCOSAMINE 1500 COMPLEX) CAPS Take 1 capsule by mouth 2 (two) times daily.     Marland Kitchen ibuprofen (ADVIL,MOTRIN) 200 MG tablet Take 200-400 mg by mouth every 6 (six) hours as needed (pain). Rarely takes    . Multiple Vitamins-Minerals (MULTIVITAMIN WITH MINERALS) tablet Take 1 tablet by mouth daily.    . naproxen sodium (ANAPROX) 220 MG tablet Take 220 mg by mouth 2 (two) times daily as needed (Pain). Rarely takes    . Omega-3 Fatty Acids (FISH OIL) 1000 MG CAPS Take 1,000 mg by mouth 2 (two) times daily.     . rivaroxaban (XARELTO) 20 MG TABS tablet Take 1 tablet (20 mg total) by mouth daily with supper. 30 tablet 11   No current facility-administered medications for this visit.     Allergies:   Penicillins   Social History:  The patient  reports that he has never smoked. He has never used smokeless tobacco. He reports that he drinks about 7.2 oz of alcohol per week .   Family History:  The patient's  family history includes Arrhythmia in his brother, mother, and sister; Diabetes in his brother; Liver disease in his father; Lung cancer in his father; Stroke in his mother.    ROS:  Please see the history of present illness.   All other systems are reviewed and negative.    PHYSICAL EXAM: VS:  BP 100/62   Pulse (!) 45   Ht 5' 10.5" (1.791 m)  Wt 173 lb 6.4 oz (78.7 kg)   BMI 24.53 kg/m  , BMI Body mass index is 24.53 kg/m. GEN: Well nourished, well developed, in no acute distress  HEENT: normal  Neck: no JVD, carotid bruits, or masses Cardiac: RRR; no murmurs, rubs, or gallops,no edema  Respiratory:  clear to auscultation bilaterally, normal work of breathing GI: soft, nontender, nondistended, + BS MS: no deformity or atrophy  Skin: warm and dry  Neuro:  Strength and sensation are intact Psych: euthymic mood, full affect  EKG:  Baseline ekg reveals sinus with RBBB/LAHB,  Recent ekg confirms afib  Wt Readings from Last 3 Encounters:   09/24/15 173 lb 6.4 oz (78.7 kg)  07/20/15 191 lb 6.4 oz (86.8 kg)  06/26/15 187 lb (84.8 kg)      Other studies Reviewed: Additional studies/ records that were reviewed today include:  Dr Aquilla Hacker notes, Medical illustrator notes, echo  Review of the above records today demonstrates: preserved EF, mild LA enlargement   ASSESSMENT AND PLAN:  1.  Paroxysmal atrial fibrillation The patient has symptomatic recurrent atrial fibrillation.  Given conduction system disease, I feel that we should avoid AADs as this would increase risks of AV block.  Therapeutic strategies for afib including medicine and ablation were discussed in detail with the patient today. Risk, benefits, and alternatives to EP study and radiofrequency ablation for afib were also discussed in detail today. These risks include but are not limited to stroke, bleeding, vascular damage, tamponade, perforation, damage to the esophagus, lungs, and other structures, pulmonary vein stenosis, worsening renal function, AV block requiring PPM, and death. The patient understands these risk and wishes to proceed.   He is initiated on xarelto at this time.  Will obtain cardiac CT prior to ablation.  2. Trifascicular block Avoid AV nodal agents I did discuss pacemaker implantation as a potential down stream evolution of his conduction system disease.  Given recent syncope, PPM implantation at this time is justified.  He is very clear that he is not interested in PPM implantation at this time.  Current medicines are reviewed at length with the patient today.   The patient does not have concerns regarding his medicines.  The following changes were made today:  none   Signed, Thompson Grayer, MD    [place] [address] [phone] (office) [phone] (fax)

## 2015-10-02 ENCOUNTER — Other Ambulatory Visit: Payer: 59 | Admitting: *Deleted

## 2015-10-02 DIAGNOSIS — I480 Paroxysmal atrial fibrillation: Secondary | ICD-10-CM

## 2015-10-02 LAB — CBC WITH DIFFERENTIAL/PLATELET
BASOS PCT: 1 %
Basophils Absolute: 46 cells/uL (ref 0–200)
EOS ABS: 92 {cells}/uL (ref 15–500)
Eosinophils Relative: 2 %
HEMATOCRIT: 40.9 % (ref 38.5–50.0)
Hemoglobin: 14.1 g/dL (ref 13.2–17.1)
LYMPHS PCT: 42 %
Lymphs Abs: 1932 cells/uL (ref 850–3900)
MCH: 32.6 pg (ref 27.0–33.0)
MCHC: 34.5 g/dL (ref 32.0–36.0)
MCV: 94.7 fL (ref 80.0–100.0)
MONO ABS: 322 {cells}/uL (ref 200–950)
MPV: 10.6 fL (ref 7.5–12.5)
Monocytes Relative: 7 %
NEUTROS PCT: 48 %
Neutro Abs: 2208 cells/uL (ref 1500–7800)
Platelets: 198 10*3/uL (ref 140–400)
RBC: 4.32 MIL/uL (ref 4.20–5.80)
RDW: 13 % (ref 11.0–15.0)
WBC: 4.6 10*3/uL (ref 3.8–10.8)

## 2015-10-02 LAB — BASIC METABOLIC PANEL
BUN: 17 mg/dL (ref 7–25)
CHLORIDE: 104 mmol/L (ref 98–110)
CO2: 28 mmol/L (ref 20–31)
Calcium: 9.5 mg/dL (ref 8.6–10.3)
Creat: 1.02 mg/dL (ref 0.70–1.25)
GLUCOSE: 90 mg/dL (ref 65–99)
POTASSIUM: 4.5 mmol/L (ref 3.5–5.3)
Sodium: 140 mmol/L (ref 135–146)

## 2015-10-09 ENCOUNTER — Ambulatory Visit (HOSPITAL_COMMUNITY)
Admission: RE | Admit: 2015-10-09 | Discharge: 2015-10-09 | Disposition: A | Discharge status: Home / Self Care | Payer: 59 | Source: Ambulatory Visit | Attending: Internal Medicine | Admitting: Internal Medicine

## 2015-10-09 DIAGNOSIS — I517 Cardiomegaly: Secondary | ICD-10-CM | POA: Diagnosis not present

## 2015-10-09 DIAGNOSIS — I480 Paroxysmal atrial fibrillation: Secondary | ICD-10-CM | POA: Diagnosis not present

## 2015-10-09 MED ORDER — IOPAMIDOL (ISOVUE-370) INJECTION 76%
INTRAVENOUS | Status: AC
  Administered 2015-10-09: 80 mL via INTRAVENOUS
  Filled 2015-10-09: qty 100

## 2015-10-09 NOTE — Progress Notes (Signed)
CT scan completed. Tolerated well. D/C home by himself, walking. Awake and alert. In no distress.

## 2015-10-15 NOTE — Anesthesia Preprocedure Evaluation (Addendum)
Anesthesia Evaluation  Patient identified by MRN, date of birth, ID band Patient awake    Reviewed: Allergy & Precautions, NPO status , Patient's Chart, lab work & pertinent test results  Airway Mallampati: I  TM Distance: >3 FB Neck ROM: Full    Dental  (+) Teeth Intact, Dental Advisory Given   Pulmonary neg pulmonary ROS,    breath sounds clear to auscultation       Cardiovascular + dysrhythmias Atrial Fibrillation  Rhythm:Irregular Rate:Abnormal     Neuro/Psych negative neurological ROS  negative psych ROS   GI/Hepatic negative GI ROS, Neg liver ROS,   Endo/Other  negative endocrine ROS  Renal/GU negative Renal ROS  negative genitourinary   Musculoskeletal negative musculoskeletal ROS (+)   Abdominal   Peds negative pediatric ROS (+)  Hematology negative hematology ROS (+)   Anesthesia Other Findings   Reproductive/Obstetrics negative OB ROS                         Lab Results  Component Value Date   WBC 4.6 10/02/2015   HGB 14.1 10/02/2015   HCT 40.9 10/02/2015   MCV 94.7 10/02/2015   PLT 198 10/02/2015   Lab Results  Component Value Date   CREATININE 1.02 10/02/2015   BUN 17 10/02/2015   NA 140 10/02/2015   K 4.5 10/02/2015   CL 104 10/02/2015   CO2 28 10/02/2015   No results found for: INR, PROTIME  09/2015 EKG: SB with AV block.  06/2015 EKG: Afib  07/2015 Echo - Left ventricle: The cavity size was at the upper limits of   normal. Wall thickness was normal. Systolic function was normal.   The estimated ejection fraction was in the range of 55% to 60%.   Left ventricular diastolic function parameters were normal. - Mitral valve: Systolic bowing without prolapse. There was mild   regurgitation directed centrally. - Left atrium: The atrium was mildly dilated. - Right ventricle: The cavity size was mildly dilated. - Right atrium: The atrium was mildly  dilated.  Anesthesia Physical Anesthesia Plan  ASA: III  Anesthesia Plan: MAC   Post-op Pain Management:    Induction: Intravenous  Airway Management Planned: Mask  Additional Equipment:   Intra-op Plan:   Post-operative Plan:   Informed Consent: I have reviewed the patients History and Physical, chart, labs and discussed the procedure including the risks, benefits and alternatives for the proposed anesthesia with the patient or authorized representative who has indicated his/her understanding and acceptance.     Plan Discussed with: CRNA  Anesthesia Plan Comments:        Anesthesia Quick Evaluation

## 2015-10-16 ENCOUNTER — Ambulatory Visit (HOSPITAL_COMMUNITY): Payer: 59 | Admitting: Anesthesiology

## 2015-10-16 ENCOUNTER — Encounter (HOSPITAL_COMMUNITY): Payer: Self-pay | Admitting: Certified Registered"

## 2015-10-16 ENCOUNTER — Ambulatory Visit (HOSPITAL_COMMUNITY)
Admission: RE | Admit: 2015-10-16 | Discharge: 2015-10-17 | Disposition: A | Discharge status: Home / Self Care | Payer: 59 | Source: Ambulatory Visit | Attending: Internal Medicine | Admitting: Internal Medicine

## 2015-10-16 ENCOUNTER — Encounter (HOSPITAL_COMMUNITY)
Admission: RE | Disposition: A | Discharge status: Home / Self Care | Payer: Self-pay | Source: Ambulatory Visit | Attending: Internal Medicine

## 2015-10-16 DIAGNOSIS — I491 Atrial premature depolarization: Secondary | ICD-10-CM | POA: Diagnosis not present

## 2015-10-16 DIAGNOSIS — Z823 Family history of stroke: Secondary | ICD-10-CM | POA: Insufficient documentation

## 2015-10-16 DIAGNOSIS — I471 Supraventricular tachycardia, unspecified: Secondary | ICD-10-CM | POA: Diagnosis present

## 2015-10-16 DIAGNOSIS — Z833 Family history of diabetes mellitus: Secondary | ICD-10-CM | POA: Insufficient documentation

## 2015-10-16 DIAGNOSIS — Z880 Allergy status to penicillin: Secondary | ICD-10-CM | POA: Insufficient documentation

## 2015-10-16 DIAGNOSIS — Z801 Family history of malignant neoplasm of trachea, bronchus and lung: Secondary | ICD-10-CM | POA: Diagnosis not present

## 2015-10-16 DIAGNOSIS — I480 Paroxysmal atrial fibrillation: Secondary | ICD-10-CM | POA: Diagnosis present

## 2015-10-16 DIAGNOSIS — Z7901 Long term (current) use of anticoagulants: Secondary | ICD-10-CM | POA: Insufficient documentation

## 2015-10-16 DIAGNOSIS — I4510 Unspecified right bundle-branch block: Secondary | ICD-10-CM | POA: Insufficient documentation

## 2015-10-16 DIAGNOSIS — I4892 Unspecified atrial flutter: Secondary | ICD-10-CM

## 2015-10-16 DIAGNOSIS — I4891 Unspecified atrial fibrillation: Secondary | ICD-10-CM | POA: Diagnosis present

## 2015-10-16 HISTORY — PX: ELECTROPHYSIOLOGIC STUDY: SHX172A

## 2015-10-16 LAB — POCT ACTIVATED CLOTTING TIME
ACTIVATED CLOTTING TIME: 290 s
ACTIVATED CLOTTING TIME: 191 s
ACTIVATED CLOTTING TIME: 186 s
Activated Clotting Time: 230 seconds
Activated Clotting Time: 263 seconds

## 2015-10-16 LAB — MRSA PCR SCREENING: MRSA by PCR: NEGATIVE

## 2015-10-16 SURGERY — ATRIAL FIBRILLATION ABLATION
Anesthesia: Monitor Anesthesia Care

## 2015-10-16 MED ORDER — RIVAROXABAN 20 MG PO TABS
20.0000 mg | ORAL_TABLET | Freq: Every day | ORAL | Status: DC
  Administered 2015-10-16: 20 mg via ORAL
  Filled 2015-10-16: qty 1

## 2015-10-16 MED ORDER — HYDROCODONE-ACETAMINOPHEN 5-325 MG PO TABS: 1.0000 | ORAL_TABLET | ORAL | Status: DC | PRN

## 2015-10-16 MED ORDER — HEPARIN SODIUM (PORCINE) 1000 UNIT/ML IJ SOLN
INTRAMUSCULAR | Status: AC
  Filled 2015-10-16: qty 1

## 2015-10-16 MED ORDER — IOPAMIDOL (ISOVUE-370) INJECTION 76%
INTRAVENOUS | Status: DC | PRN
  Administered 2015-10-16: 1 mL via INTRAVENOUS

## 2015-10-16 MED ORDER — ACETAMINOPHEN 325 MG PO TABS: 650.0000 mg | ORAL_TABLET | ORAL | Status: DC | PRN

## 2015-10-16 MED ORDER — SODIUM CHLORIDE 0.9% FLUSH
3.0000 mL | Freq: Two times a day (BID) | INTRAVENOUS | Status: DC
  Administered 2015-10-16: 3 mL via INTRAVENOUS

## 2015-10-16 MED ORDER — SODIUM CHLORIDE 0.9 % IV SOLN
INTRAVENOUS | Status: DC
  Administered 2015-10-16 (×2): via INTRAVENOUS

## 2015-10-16 MED ORDER — HEPARIN SODIUM (PORCINE) 1000 UNIT/ML IJ SOLN
INTRAMUSCULAR | Status: DC | PRN
  Administered 2015-10-16: 4000 [IU] via INTRAVENOUS
  Administered 2015-10-16: 5000 [IU] via INTRAVENOUS
  Administered 2015-10-16: 12000 [IU] via INTRAVENOUS
  Administered 2015-10-16: 5000 [IU] via INTRAVENOUS

## 2015-10-16 MED ORDER — PROTAMINE SULFATE 10 MG/ML IV SOLN
INTRAVENOUS | Status: DC | PRN
  Administered 2015-10-16 (×3): 10 mg via INTRAVENOUS

## 2015-10-16 MED ORDER — IOPAMIDOL (ISOVUE-370) INJECTION 76%
INTRAVENOUS | Status: AC
  Filled 2015-10-16: qty 50

## 2015-10-16 MED ORDER — ISOPROTERENOL HCL 0.2 MG/ML IJ SOLN
INTRAVENOUS | Status: DC | PRN
  Administered 2015-10-16: 10 ug/min via INTRAVENOUS

## 2015-10-16 MED ORDER — MIDAZOLAM HCL 5 MG/5ML IJ SOLN
INTRAMUSCULAR | Status: DC | PRN
  Administered 2015-10-16: 2 mg via INTRAVENOUS

## 2015-10-16 MED ORDER — BUPIVACAINE HCL (PF) 0.25 % IJ SOLN
INTRAMUSCULAR | Status: AC
  Filled 2015-10-16: qty 30

## 2015-10-16 MED ORDER — ONDANSETRON HCL 4 MG/2ML IJ SOLN: 4.0000 mg | Freq: Four times a day (QID) | INTRAMUSCULAR | Status: DC | PRN

## 2015-10-16 MED ORDER — ISOPROTERENOL HCL 0.2 MG/ML IJ SOLN
INTRAMUSCULAR | Status: AC
  Filled 2015-10-16: qty 5

## 2015-10-16 MED ORDER — HEPARIN SODIUM (PORCINE) 1000 UNIT/ML IJ SOLN
INTRAMUSCULAR | Status: DC | PRN
  Administered 2015-10-16: 1000 [IU] via INTRAVENOUS

## 2015-10-16 MED ORDER — SODIUM CHLORIDE 0.9% FLUSH: 3.0000 mL | INTRAVENOUS | Status: DC | PRN

## 2015-10-16 MED ORDER — SODIUM CHLORIDE 0.9 % IV SOLN
INTRAVENOUS | Status: DC | PRN
  Administered 2015-10-16: 10 ug/min via INTRAVENOUS

## 2015-10-16 MED ORDER — SODIUM CHLORIDE 0.9 % IV SOLN: 250.0000 mL | INTRAVENOUS | Status: DC | PRN

## 2015-10-16 MED ORDER — PROPOFOL 500 MG/50ML IV EMUL
INTRAVENOUS | Status: DC | PRN
  Administered 2015-10-16: 50 ug/kg/min via INTRAVENOUS

## 2015-10-16 MED ORDER — FENTANYL CITRATE (PF) 100 MCG/2ML IJ SOLN
INTRAMUSCULAR | Status: DC | PRN
  Administered 2015-10-16 (×3): 25 ug via INTRAVENOUS
  Administered 2015-10-16: 50 ug via INTRAVENOUS
  Administered 2015-10-16 (×3): 25 ug via INTRAVENOUS

## 2015-10-16 MED ORDER — BUPIVACAINE HCL (PF) 0.25 % IJ SOLN
INTRAMUSCULAR | Status: DC | PRN
Start: 2015-10-16 — End: 2015-10-16
  Administered 2015-10-16: 20 mL

## 2015-10-16 SURGICAL SUPPLY — 20 items
BAG SNAP BAND KOVER 36X36 (MISCELLANEOUS) ×2 IMPLANT
BLANKET WARM UNDERBOD FULL ACC (MISCELLANEOUS) ×2 IMPLANT
CATH JOSEPHSON QUAD-ALLRED 6FR (CATHETERS) ×1 IMPLANT
CATH NAVISTAR SMARTTOUCH DF (ABLATOR) ×1 IMPLANT
CATH SOUNDSTAR 3D IMAGING (CATHETERS) ×1 IMPLANT
CATH VARIABLE LASSO NAV 2515 (CATHETERS) ×1 IMPLANT
CATH WEBSTER BI DIR CS D-F CRV (CATHETERS) ×1 IMPLANT
COVER SWIFTLINK CONNECTOR (BAG) ×2 IMPLANT
NDL TRANSEP BRK 71CM 407200 (NEEDLE) IMPLANT
NEEDLE TRANSEP BRK 71CM 407200 (NEEDLE) ×2 IMPLANT
PACK EP LATEX FREE (CUSTOM PROCEDURE TRAY) ×2
PACK EP LF (CUSTOM PROCEDURE TRAY) ×1 IMPLANT
PAD DEFIB LIFELINK (PAD) ×2 IMPLANT
PATCH CARTO3 (PAD) ×1 IMPLANT
SHEATH AVANTI 11F 11CM (SHEATH) ×1 IMPLANT
SHEATH PINNACLE 7F 10CM (SHEATH) ×2 IMPLANT
SHEATH PINNACLE 9F 10CM (SHEATH) ×1 IMPLANT
SHEATH SWARTZ TS SL2 63CM 8.5F (SHEATH) ×1 IMPLANT
SHIELD RADPAD SCOOP 12X17 (MISCELLANEOUS) ×2 IMPLANT
TUBING SMART ABLATE COOLFLOW (TUBING) ×1 IMPLANT

## 2015-10-16 NOTE — Anesthesia Procedure Notes (Signed)
Procedure Name: MAC Date/Time: 10/16/2015 7:42 AM Performed by: Barrington Ellison Pre-anesthesia Checklist: Patient identified, Emergency Drugs available, Suction available and Patient being monitored Patient Re-evaluated:Patient Re-evaluated prior to inductionOxygen Delivery Method: Simple face mask

## 2015-10-16 NOTE — H&P (View-Only) (Signed)
Electrophysiology Office Note   Date:  09/30/2015   ID:  Kevin Castro, DOB 1945-05-26, MRN [id number]  PCP:  Horatio Pel, MD  Primary Electrophysiologist: Dr Caryl Comes  Chief Complaint  Patient presents with  . Atrial Fibrillation     History of Present Illness: Kevin Castro is a 70 y.o. male who presents today for electrophysiology follow-up.   The patient is referred for consideration of ablation.  He has had afib/ atach for about 10 years.  He was initially seen by Dr Rosita Fire at [facility].  He had an isuprel infusion without inducible atach at that time.  He has never had ablation. He has been seen by Dr Caryl Comes for the past few years.  He has been diagnosed with atrial fibrillation.  He also has RBBB/LAHB.  He has not tried AADs due to conduction system disease.  He remains very active and continues to exercise regularly without limitation.  AF occurs about 2-3 times per year.  Most recently he had an episode of AF with tachypalpitations and transient syncope.  EKG at that time revealed AF.   Though previous episodes of afib occurred with exercise, this particular episode occurred at rest.  He has done well since I saw him last.  He now wishes to consider ablation. Today, he denies symptoms of palpitations, chest pain, shortness of breath, orthopnea, PND, lower extremity edema, claudication, dizziness, presyncope, syncope, bleeding, or neurologic sequela. The patient is tolerating medications without difficulties and is otherwise without complaint today.    Past Medical History:  Diagnosis Date  . Atrial tachycardia ([place])   . Paroxysmal atrial fibrillation ([facility])   . Sinus bradycardia   . Trifascicular block    Past Surgical History:  Procedure Laterality Date  . DOBUTAMINE STRESS ECHO    . ELECTROPHYSIOLOGIC STUDY       Current Outpatient Prescriptions  Medication Sig Dispense Refill  . acetaminophen (TYLENOL) 325 MG tablet Take 650 mg by mouth daily as needed (pain).  Rarely takes    . Glucosamine-Chondroit-Vit C-Mn (GLUCOSAMINE 1500 COMPLEX) CAPS Take 1 capsule by mouth 2 (two) times daily.     Marland Kitchen ibuprofen (ADVIL,MOTRIN) 200 MG tablet Take 200-400 mg by mouth every 6 (six) hours as needed (pain). Rarely takes    . Multiple Vitamins-Minerals (MULTIVITAMIN WITH MINERALS) tablet Take 1 tablet by mouth daily.    . naproxen sodium (ANAPROX) 220 MG tablet Take 220 mg by mouth 2 (two) times daily as needed (Pain). Rarely takes    . Omega-3 Fatty Acids (FISH OIL) 1000 MG CAPS Take 1,000 mg by mouth 2 (two) times daily.     . rivaroxaban (XARELTO) 20 MG TABS tablet Take 1 tablet (20 mg total) by mouth daily with supper. 30 tablet 11   No current facility-administered medications for this visit.     Allergies:   Penicillins   Social History:  The patient  reports that he has never smoked. He has never used smokeless tobacco. He reports that he drinks about 7.2 oz of alcohol per week .   Family History:  The patient's  family history includes Arrhythmia in his brother, mother, and sister; Diabetes in his brother; Liver disease in his father; Lung cancer in his father; Stroke in his mother.    ROS:  Please see the history of present illness.   All other systems are reviewed and negative.    PHYSICAL EXAM: VS:  BP 100/62   Pulse (!) 45   Ht 5' 10.5" (1.791 m)  Wt 173 lb 6.4 oz (78.7 kg)   BMI 24.53 kg/m  , BMI Body mass index is 24.53 kg/m. GEN: Well nourished, well developed, in no acute distress  HEENT: normal  Neck: no JVD, carotid bruits, or masses Cardiac: RRR; no murmurs, rubs, or gallops,no edema  Respiratory:  clear to auscultation bilaterally, normal work of breathing GI: soft, nontender, nondistended, + BS MS: no deformity or atrophy  Skin: warm and dry  Neuro:  Strength and sensation are intact Psych: euthymic mood, full affect  EKG:  Baseline ekg reveals sinus with RBBB/LAHB,  Recent ekg confirms afib  Wt Readings from Last 3 Encounters:   09/24/15 173 lb 6.4 oz (78.7 kg)  07/20/15 191 lb 6.4 oz (86.8 kg)  06/26/15 187 lb (84.8 kg)      Other studies Reviewed: Additional studies/ records that were reviewed today include:  Dr Aquilla Hacker notes, Medical illustrator notes, echo  Review of the above records today demonstrates: preserved EF, mild LA enlargement   ASSESSMENT AND PLAN:  1.  Paroxysmal atrial fibrillation The patient has symptomatic recurrent atrial fibrillation.  Given conduction system disease, I feel that we should avoid AADs as this would increase risks of AV block.  Therapeutic strategies for afib including medicine and ablation were discussed in detail with the patient today. Risk, benefits, and alternatives to EP study and radiofrequency ablation for afib were also discussed in detail today. These risks include but are not limited to stroke, bleeding, vascular damage, tamponade, perforation, damage to the esophagus, lungs, and other structures, pulmonary vein stenosis, worsening renal function, AV block requiring PPM, and death. The patient understands these risk and wishes to proceed.   He is initiated on xarelto at this time.  Will obtain cardiac CT prior to ablation.  2. Trifascicular block Avoid AV nodal agents I did discuss pacemaker implantation as a potential down stream evolution of his conduction system disease.  Given recent syncope, PPM implantation at this time is justified.  He is very clear that he is not interested in PPM implantation at this time.  Current medicines are reviewed at length with the patient today.   The patient does not have concerns regarding his medicines.  The following changes were made today:  none   Signed, Thompson Grayer, MD    [place] [address] [phone] (office) [phone] (fax)

## 2015-10-16 NOTE — Discharge Summary (Signed)
ELECTROPHYSIOLOGY PROCEDURE DISCHARGE SUMMARY    Patient ID: Kevin Castro,  MRN: [id number], DOB/AGE: February 13, 1945 70 y.o.  Admit date: 10/16/2015 Discharge date: 10/17/2015  Primary Care Physician: Horatio Pel, MD Electrophysiologist: Thomas Hoff, MD  Primary Discharge Diagnosis:  Paroxysmal atrial fibrillation and atrial flutter status post ablation this admission  Secondary Discharge Diagnosis:  1.  Atrial tachycardia 2.  Sinus bradycardia 3.  Trifasicular block   Procedures This Admission:  1.  Electrophysiology study and radiofrequency catheter ablation on 10/16/15 by Dr Thompson Grayer.  This study demonstrated sinus rhythm upon presentation; intracardiac echo reveals a moderate sized left atrium with four separate pulmonary veins without evidence of pulmonary vein stenosis; successful electrical isolation and anatomical encircling of all four pulmonary veins with radiofrequency current; cavo-tricuspid isthmus ablation was performed with complete bidirectional isthmus block achieved; no sustained arrhythmias following ablation both on and off of Isuprel; no early apparent complications.  Brief HPI: Kevin Castro is a 70 y.o. male with a history of paroxysmal atrial fibrillation.  He is a poor candidate for AAD therapy with conduction system disease. Risks, benefits, and alternatives to catheter ablation of atrial fibrillation were reviewed with the patient who wished to proceed.  The patient underwent cardiac CT prior to the procedure which demonstrated no LAA thrombus.    Hospital Course:  The patient was admitted and underwent EPS/RFCA of atrial fibrillation with details as outlined above.  They were monitored on telemetry overnight which demonstrated sinus rhythm.  Groin was without complication on the day of discharge.  The patient was examined and considered to be stable for discharge.  Wound care and restrictions were reviewed with the patient.  The patient will  be seen back by Roderic Palau, NP in 4 weeks and Dr Rayann Heman in 12 weeks for post ablation follow up.    Physical Exam: Vitals:   10/16/15 2000 10/16/15 2200 10/17/15 0335 10/17/15 0812  BP: (!) 109/59 (!) 82/43 109/65 107/62  Pulse: (!) 52 (!) 51    Resp: 13 15 14 18   Temp: 99.1 F (37.3 C)  98.3 F (36.8 C) 98 F (36.7 C)  TempSrc: Oral  Axillary Oral  SpO2: 100% 95% 96% 98%  Weight:      Height:        GEN- The patient is well appearing, alert and oriented x 3 today.   HEENT: normocephalic, atraumatic; sclera clear, conjunctiva pink; hearing intact; oropharynx clear; neck supple  Lungs- Clear to ausculation bilaterally, normal work of breathing.  No wheezes, rales, rhonchi Heart- Regular rate and rhythm, no murmurs, rubs or gallops  GI- soft, non-tender, non-distended, bowel sounds present  Extremities- no clubbing, cyanosis, or edema; DP/PT/radial pulses 2+ bilaterally, groin without hematoma/bruit MS- no significant deformity or atrophy Skin- warm and dry, no rash or lesion Psych- euthymic mood, full affect Neuro- strength and sensation are intact   Labs:   Lab Results  Component Value Date   WBC 4.6 10/02/2015   HGB 14.1 10/02/2015   HCT 40.9 10/02/2015   MCV 94.7 10/02/2015   PLT 198 10/02/2015   No results for input(s): NA, K, CL, CO2, BUN, CREATININE, CALCIUM, PROT, BILITOT, ALKPHOS, ALT, AST, GLUCOSE in the last 168 hours.  Invalid input(s): LABALBU   Discharge Medications:    Medication List    STOP taking these medications   ibuprofen 200 MG tablet Commonly known as:  ADVIL,MOTRIN   naproxen sodium 220 MG tablet Commonly known as:  ANAPROX     TAKE these  medications   acetaminophen 325 MG tablet Commonly known as:  TYLENOL Take 650 mg by mouth daily as needed (pain). Rarely takes   Fish Oil 1000 MG Caps Take 1,000 mg by mouth 2 (two) times daily.   GLUCOSAMINE 1500 COMPLEX Caps Take 1 capsule by mouth 2 (two) times daily.   multivitamin  with minerals tablet Take 1 tablet by mouth daily.   rivaroxaban 20 MG Tabs tablet Commonly known as:  XARELTO Take 1 tablet (20 mg total) by mouth daily with supper.       Disposition:  Discharge Instructions    Call MD for:  redness, tenderness, or signs of infection (pain, swelling, redness, odor or green/yellow discharge around incision site)    Complete by:  As directed    Diet - low sodium heart healthy    Complete by:  As directed    Discharge instructions    Complete by:  As directed    Void NSAID (like ibuprofen, naprosyn etc) while on anticoagulant Xarelto due to increased bleeding risk. You can take Tylenol for pain.   Increase activity slowly    Complete by:  As directed      Follow-up Information    [facility] Follow up on 11/10/2015.   Specialty:  Cardiology Why:  at [facility] information: [address] [id number] [place] [id number] [place], [place] Follow up on 01/20/2016.   Specialty:  Cardiology Why:  at 4:15PM  Contact information: [place] [address] [phone]           Duration of Discharge Encounter: Greater than 30 minutes including physician time.  Signed, Bhagat,Bhavinkumar, PAC  I have seen and examined this patient with Vin Bhagat.  Agree with above, note added to reflect my findings.  On exam, regular rhythm, no murmurs, lungs clear. Had AF ablation without issues.  Plan for discharge today with follow up in clinic.    Will M. Camnitz MD 10/17/2015 1:36 PM

## 2015-10-16 NOTE — Discharge Instructions (Signed)
You have an appointment set up with the [facility].  Multiple studies have shown that being followed by a dedicated atrial fibrillation clinic in addition to the standard care you receive from your other physicians improves health. We believe that enrollment in the atrial fibrillation clinic will allow [place] to better care for you.   The phone number to the [facility] is [phone]. The clinic is staffed Monday through Friday from 8:30am to 5pm.  Parking Directions: The clinic is located in the Heart and Vascular Building connected to [facility]. 1)From [address] turn on to [address] and go to the 3rd entrance  (Heart and Vascular entrance) on the right. 2)Look to the right for Heart &Vascular Parking Garage. 3)A code for the entrance is required please call the clinic to receive this.   4)Take the elevators to the 1st floor. Registration is in the room with the glass walls at the end of the hallway.  If you have any trouble parking or locating the clinic, please dont hesitate to call [phone].  No driving for 4 days. No lifting over 5 lbs for 1 week. No sexual activity for 1 week. You may return to work in 1 week. Keep procedure site clean & dry. If you notice increased pain, swelling, bleeding or pus, call/return!  You may shower, but no soaking baths/hot tubs/pools for 1 week.

## 2015-10-16 NOTE — Progress Notes (Addendum)
Site area: RFV x 3 Site Prior to Removal:  Level 0 Pressure Applied For:30 min Manual:   yes Patient Status During Pull: stable  Post Pull Site:  Level Post Pull Instructions Given:  yes Post Pull Pulses Present: palpable Dressing Applied:  tegaderm Bedrest begins @ 1300 till 1900 Comments:

## 2015-10-16 NOTE — Transfer of Care (Signed)
Immediate Anesthesia Transfer of Care Note  Patient: Kevin Castro  Procedure(s) Performed: Procedure(s): Atrial Fibrillation Ablation (N/A)  Patient Location: PACU and Cath Lab  Anesthesia Type:MAC  Level of Consciousness: awake, alert  and oriented  Airway & Oxygen Therapy: Patient Spontanous Breathing  Post-op Assessment: Report given to RN  Post vital signs: Reviewed and stable  Last Vitals:  Vitals:   10/16/15 0542  BP: 117/63  Pulse: (!) 43  Resp: 18  Temp: 36.8 C    Last Pain:  Vitals:   10/16/15 0542  TempSrc: Oral         Complications: No apparent anesthesia complications

## 2015-10-16 NOTE — Anesthesia Postprocedure Evaluation (Signed)
Anesthesia Post Note  Patient: Kevin Castro  Procedure(s) Performed: Procedure(s) (LRB): Atrial Fibrillation Ablation (N/A)  Patient location during evaluation: PACU Anesthesia Type: MAC Level of consciousness: awake and alert Pain management: pain level controlled Vital Signs Assessment: post-procedure vital signs reviewed and stable Respiratory status: spontaneous breathing, nonlabored ventilation, respiratory function stable and patient connected to nasal cannula oxygen Cardiovascular status: stable and blood pressure returned to baseline Anesthetic complications: no    Last Vitals:  Vitals:   10/16/15 1106 10/16/15 1107  BP:  100/64  Pulse: (!) 53   Resp:    Temp:  36.5 C    Last Pain:  Vitals:   10/16/15 1107  TempSrc: Oral                 Effie Berkshire

## 2015-10-16 NOTE — Interval H&P Note (Signed)
History and Physical Interval Note:  10/16/2015 7:34 AM  Kevin Castro  has presented today for surgery, with the diagnosis of afib  The various methods of treatment have been discussed with the patient and family. After consideration of risks, benefits and other options for treatment, the patient has consented to  Procedure(s): Atrial Fibrillation Ablation (N/A) as a surgical intervention .  The patient's history has been reviewed, patient examined, no change in status, stable for surgery.  I have reviewed the patient's chart and labs.  Questions were answered to the patient's satisfaction.     Thompson Grayer

## 2015-10-17 DIAGNOSIS — I471 Supraventricular tachycardia: Secondary | ICD-10-CM | POA: Diagnosis not present

## 2015-10-17 DIAGNOSIS — I480 Paroxysmal atrial fibrillation: Secondary | ICD-10-CM

## 2015-10-17 DIAGNOSIS — I491 Atrial premature depolarization: Secondary | ICD-10-CM | POA: Diagnosis not present

## 2015-10-17 DIAGNOSIS — I4892 Unspecified atrial flutter: Secondary | ICD-10-CM | POA: Diagnosis not present

## 2015-10-17 NOTE — Progress Notes (Signed)
Pt. Order for discharge. Pt education given, IV removed, ride called and pt transported to wife at front of hospital.

## 2015-10-17 NOTE — Progress Notes (Signed)
SUBJECTIVE: The patient is doing well today.  At this time, he denies chest pain, shortness of breath, or any new concerns. AF ablation yesterday without complication.  Sinus rhythm this AM.  . rivaroxaban  20 mg Oral Q supper  . sodium chloride flush  3 mL Intravenous Q12H      OBJECTIVE: Physical Exam: Vitals:   10/16/15 2000 10/16/15 2200 10/17/15 0335 10/17/15 0812  BP: (!) 109/59 (!) 82/43 109/65 107/62  Pulse: (!) 52 (!) 51    Resp: 13 15 14 18   Temp: 99.1 F (37.3 C)  98.3 F (36.8 C) 98 F (36.7 C)  TempSrc: Oral  Axillary Oral  SpO2: 100% 95% 96% 98%  Weight:      Height:        Intake/Output Summary (Last 24 hours) at 10/17/15 0834 Last data filed at 10/16/15 1900  Gross per 24 hour  Intake              980 ml  Output              200 ml  Net              780 ml    Telemetry reveals sinus rhythm  GEN- The patient is well appearing, alert and oriented x 3 today.   Head- normocephalic, atraumatic Eyes-  Sclera clear, conjunctiva pink Ears- hearing intact Oropharynx- clear Neck- supple, no JVP Lymph- no cervical lymphadenopathy Lungs- Clear to ausculation bilaterally, normal work of breathing Heart- Regular rate and rhythm, no murmurs, rubs or gallops, PMI not laterally displaced GI- soft, NT, ND, + BS Extremities- no clubbing, cyanosis, or edema Skin- no rash or lesion Psych- euthymic mood, full affect Neuro- strength and sensation are intact  LABS: Basic Metabolic Panel: No results for input(s): NA, K, CL, CO2, GLUCOSE, BUN, CREATININE, CALCIUM, MG, PHOS in the last 72 hours. Liver Function Tests: No results for input(s): AST, ALT, ALKPHOS, BILITOT, PROT, ALBUMIN in the last 72 hours. No results for input(s): LIPASE, AMYLASE in the last 72 hours. CBC: No results for input(s): WBC, NEUTROABS, HGB, HCT, MCV, PLT in the last 72 hours. Cardiac Enzymes: No results for input(s): CKTOTAL, CKMB, CKMBINDEX, TROPONINI in the last 72 hours. BNP: Invalid  input(s): POCBNP D-Dimer: No results for input(s): DDIMER in the last 72 hours. Hemoglobin A1C: No results for input(s): HGBA1C in the last 72 hours. Fasting Lipid Panel: No results for input(s): CHOL, HDL, LDLCALC, TRIG, CHOLHDL, LDLDIRECT in the last 72 hours. Thyroid Function Tests: No results for input(s): TSH, T4TOTAL, T3FREE, THYROIDAB in the last 72 hours.  Invalid input(s): FREET3 Anemia Panel: No results for input(s): VITAMINB12, FOLATE, FERRITIN, TIBC, IRON, RETICCTPCT in the last 72 hours.  RADIOLOGY: Ct Cardiac Morph/pulm Vein W/cm&w/o Ca Score  Addendum Date: 10/09/2015   ADDENDUM REPORT: 10/09/2015 12:58 CLINICAL DATA:  Pre Ablation EXAM: Cardiac CTA MEDICATIONS: No medications TECHNIQUE: The patient was scanned on a Philips [id number] slice scanner. Gantry rotation speed was 270 msecs. Collimation was .21mm. A 100 kV prospective scan was triggered in the descending thoracic aorta at 111 HU's with 5% padding centered around 78% of the R-R interval. Average HR during the scan was 48 bpm. The 3D data set was interpreted on a dedicated work station using MPR, MIP and VRT modes. A total of 80cc of contrast was used. FINDINGS: Non-cardiac: See separate report from [facility]. No significant findings on limited lung and soft tissue windows. Calcium Score:  0 Mild biatrial enlargement.  Mild RV enlargement. No ASD/VSD or pericardial effusion. The esophagus runs closest to the LLPV ostium. The left atrial appendage is windsock in morphology with ostium Of 22 mm and depth of 33 mm No thrombus identified. 4 PV;s draining normally into the LA with dimensions as below: LUPV:  Ostium 23 mm Area 3.4 cm2 LLPV:  Ostium 19 mm Area 1.6 cm2 RUPV:  Ostium 19.5 mm Area 3.6 cm2 RLPV:  Ostium 18.5 mm Area 2.5 cm2 IMPRESSION: 1) Normal PVls draining into LA with measurements as above 2) Mild biatrial enlargement 3) No ASD/PFO 4) No pericardial effusion 5) Esophagus courses closest to LLPV ostium 6) Windsock  LAA with no thrombus 7) Mild RV enlargement Jenkins Rouge Electronically Signed   By: Jenkins Rouge M.D.   On: 10/09/2015 12:58   Result Date: 10/09/2015 EXAM: OVER-READ INTERPRETATION  CT CHEST The following report is an over-read performed by radiologist Dr. Rebekah Chesterfield [facility] on 10/09/2015. This over-read does not include interpretation of cardiac or coronary anatomy or pathology. The coronary CTA interpretation by the cardiologist is attached. COMPARISON:  Coronary calcium score scan 01/08/2014. FINDINGS: Within the visualized portions of the thorax there heart no suspicious appearing pulmonary nodules or masses, there is no acute consolidative airspace disease, no pleural effusions, no pneumothorax and no lymphadenopathy. Visualized portions of the upper thorax are unremarkable. No aggressive appearing lytic or blastic lesions are noted visualized portions of the skeleton. IMPRESSION: 1. No significant incidental noncardiac findings noted. Electronically Signed: By: Vinnie Langton M.D. On: 10/09/2015 12:39    ASSESSMENT AND PLAN:  Active Problems:   PSVT (paroxysmal supraventricular tachycardia) (HCC)   Paroxysmal atrial fibrillation (HCC)   A-fib ([place]) Had ablation for atrial fibrillation yesterday without issues.  Plan for discharge today as in sinus rhythm.  Zeth Buday remain on Xarelto for anticoagulation.  Travis Purk Meredith Leeds, MD 10/17/2015 8:34 AM

## 2015-10-19 ENCOUNTER — Encounter (HOSPITAL_COMMUNITY): Payer: Self-pay | Admitting: Internal Medicine

## 2015-10-22 ENCOUNTER — Telehealth: Payer: Self-pay | Admitting: Internal Medicine

## 2015-10-22 NOTE — Telephone Encounter (Signed)
New message     Patient S/p ablation last Friday - C/O not feeling well, increase heart rate . Unable to climb stairs. Patient verbalize it feels like back in Afib again.

## 2015-10-22 NOTE — Telephone Encounter (Signed)
Talked with patient states he has noticed on his garmin "which is pretty darn accurate" that his heart rate is resting in the upper 50s and gets up near 88-100 with exertion which is not normal for him. HR does goes back down in 50s with rest. States he is just exhausted doing minor exertional things such as climbing the stairs. Reiterated less than 1 week out from ablation that is not abnormal to fatigue easier and also to have afib post procedure. Pt reassured and felt more comfortable with his symptoms after conversation. Instructed to call back if HR consistently staying above 100 or increased shortness of breath/chest pain. Pt verbalized understanding and will call back if further issues.

## 2015-11-10 ENCOUNTER — Ambulatory Visit (HOSPITAL_COMMUNITY)
Admission: RE | Admit: 2015-11-10 | Discharge: 2015-11-10 | Disposition: A | Discharge status: Home / Self Care | Payer: 59 | Source: Ambulatory Visit | Attending: Nurse Practitioner | Admitting: Nurse Practitioner

## 2015-11-10 ENCOUNTER — Encounter (HOSPITAL_COMMUNITY): Payer: Self-pay | Admitting: Nurse Practitioner

## 2015-11-10 VITALS — BP 128/84 | HR 62 | Ht 70.5 in | Wt 177.4 lb

## 2015-11-10 DIAGNOSIS — R001 Bradycardia, unspecified: Secondary | ICD-10-CM | POA: Diagnosis not present

## 2015-11-10 DIAGNOSIS — I480 Paroxysmal atrial fibrillation: Secondary | ICD-10-CM | POA: Diagnosis present

## 2015-11-10 DIAGNOSIS — I452 Bifascicular block: Secondary | ICD-10-CM | POA: Insufficient documentation

## 2015-11-10 DIAGNOSIS — Z880 Allergy status to penicillin: Secondary | ICD-10-CM | POA: Insufficient documentation

## 2015-11-10 DIAGNOSIS — Z833 Family history of diabetes mellitus: Secondary | ICD-10-CM | POA: Insufficient documentation

## 2015-11-10 DIAGNOSIS — Z7901 Long term (current) use of anticoagulants: Secondary | ICD-10-CM | POA: Diagnosis not present

## 2015-11-10 DIAGNOSIS — Z801 Family history of malignant neoplasm of trachea, bronchus and lung: Secondary | ICD-10-CM | POA: Insufficient documentation

## 2015-11-10 DIAGNOSIS — Z9889 Other specified postprocedural states: Secondary | ICD-10-CM | POA: Insufficient documentation

## 2015-11-10 DIAGNOSIS — Z8249 Family history of ischemic heart disease and other diseases of the circulatory system: Secondary | ICD-10-CM | POA: Insufficient documentation

## 2015-11-10 DIAGNOSIS — I453 Trifascicular block: Secondary | ICD-10-CM | POA: Insufficient documentation

## 2015-11-10 DIAGNOSIS — Z823 Family history of stroke: Secondary | ICD-10-CM | POA: Diagnosis not present

## 2015-11-10 NOTE — Progress Notes (Signed)
Primary Care Physician: Horatio Pel, MD Primary Electrophysiologist: Leandra Kern is a 70 y.o. male with a history of paroxysmal atrial fibrillation who presents for follow up in the [facility].  Since recent PVI, the patient reports doing relatively well.  He has noticed increased resting heart rate since ablation that is disconcerting to him.  He has also noticed that he has not had the same recovery heart rate while bike riding that he did prior to ablation.  He is occasionally dizzy with climbing a flight of stairs and a HR of 100 by his FitBit.  Today, he denies symptoms of palpitations, chest pain, shortness of breath, orthopnea, PND, lower extremity edema, dizziness, presyncope, syncope, snoring, daytime somnolence, bleeding, or neurologic sequela. The patient is tolerating medications without difficulties and is otherwise without complaint today.    Past Medical History:  Diagnosis Date  . Atrial tachycardia ([place])   . Paroxysmal atrial fibrillation (HCC)   . Sinus bradycardia   . Trifascicular block    Past Surgical History:  Procedure Laterality Date  . DOBUTAMINE STRESS ECHO    . ELECTROPHYSIOLOGIC STUDY    . ELECTROPHYSIOLOGIC STUDY N/A 10/16/2015   Procedure: Atrial Fibrillation Ablation;  Surgeon: Thompson Grayer, MD;  Location: [facility];  Service: Cardiovascular;  Laterality: N/A;    Current Outpatient Prescriptions  Medication Sig Dispense Refill  . acetaminophen (TYLENOL) 325 MG tablet Take 650 mg by mouth daily as needed (pain). Rarely takes    . Glucosamine-Chondroit-Vit C-Mn (GLUCOSAMINE 1500 COMPLEX) CAPS Take 1 capsule by mouth 2 (two) times daily.     . Multiple Vitamins-Minerals (MULTIVITAMIN WITH MINERALS) tablet Take 1 tablet by mouth daily.    . Omega-3 Fatty Acids (FISH OIL) 1000 MG CAPS Take 1,000 mg by mouth 2 (two) times daily.     . rivaroxaban (XARELTO) 20 MG TABS tablet Take 1 tablet (20 mg total)  by mouth daily with supper. 30 tablet 11   No current facility-administered medications for this encounter.     Allergies  Allergen Reactions  . Penicillins Rash    Rash/blisters Has patient had a PCN reaction causing immediate rash, facial/tongue/throat swelling, SOB or lightheadedness with hypotension: Yes Has patient had a PCN reaction causing severe rash involving mucus membranes or skin necrosis: No Has patient had a PCN reaction that required hospitalization No Has patient had a PCN reaction occurring within the last 10 years: No If all of the above answers are "NO", then may proceed with Cephalosporin use.     Social History   Social History  . Marital status: Married    Spouse name: N/A  . Number of children: N/A  . Years of education: N/A   Occupational History  . Not on file.   Social History Main Topics  . Smoking status: Never Smoker  . Smokeless tobacco: Never Used  . Alcohol use 7.2 oz/week    12 Cans of beer per week     Comment: "can go through a couple six packs on the weekend"  . Drug use: No  . Sexual activity: Not on file   Other Topics Concern  . Not on file   Social History Narrative  . No narrative on file    Family History  Problem Relation Age of Onset  . Arrhythmia Mother   . Stroke Mother     42  . Lung cancer Father   . Liver disease Father   . Arrhythmia Sister   .  Arrhythmia Brother   . Diabetes Brother     oldest brother  . Heart attack Neg Hx   . Hypertension Neg Hx     ROS- All systems are reviewed and negative except as per the HPI above.  Physical Exam: Vitals:   11/10/15 1006  BP: 128/84  Pulse: 62  Weight: 177 lb 6.4 oz (80.5 kg)  Height: 5' 10.5" (1.791 m)    GEN- The patient is well appearing, alert and oriented x 3 today.   Head- normocephalic, atraumatic Eyes-  Sclera clear, conjunctiva pink Ears- hearing intact Oropharynx- clear Neck- supple  Lungs- Clear to ausculation bilaterally, normal work of  breathing Heart- Regular rate and rhythm, no murmurs, rubs or gallops  GI- soft, NT, ND, + BS Extremities- no clubbing, cyanosis, or edema MS- no significant deformity or atrophy Skin- no rash or lesion Psych- euthymic mood, full affect Neuro- strength and sensation are intact  Wt Readings from Last 3 Encounters:  11/10/15 177 lb 6.4 oz (80.5 kg)  10/16/15 177 lb 14.6 oz (80.7 kg)  09/24/15 173 lb 6.4 oz (78.7 kg)    EKG today demonstrates sinus rhythm, rate 62, chronic bifascicular block  Epic records are reviewed at length today  Assessment and Plan:  1. Paroxysmal atrial fibrillation Maintaining SR post ablation.  He is concerned about his increased resting heart rate and exercise intolerance.  He has not been doing much activity the last 3 weeks post ablation. I have encouraged him to gradually increase activity and track heart rate as he has been doing.  He will bring records to next appt with Dr Rayann Heman to review further.   Continue Xarelto for CHADS2VASC of 1  2.  Bifascicular block Chronic and asymptomatic We have reviewed ER precautions and symptoms for which he should call the office   Follow up with Dr Rayann Heman as scheduled   Chanetta Marshall, NP 11/10/2015 2:32 PM

## 2015-11-11 ENCOUNTER — Ambulatory Visit (HOSPITAL_COMMUNITY): Payer: 59 | Admitting: Nurse Practitioner

## 2015-11-18 ENCOUNTER — Other Ambulatory Visit: Payer: Self-pay | Admitting: Internal Medicine

## 2015-11-18 MED ORDER — RIVAROXABAN 20 MG PO TABS: 20.0000 mg | ORAL_TABLET | Freq: Every day | ORAL | 3 refills | Status: DC

## 2015-11-18 NOTE — Telephone Encounter (Signed)
Rx was resent to a mail order pharmacy as requested. Confirmation received.

## 2016-01-13 ENCOUNTER — Encounter: Payer: Self-pay | Admitting: Internal Medicine

## 2016-01-20 ENCOUNTER — Encounter: Payer: Self-pay | Admitting: Internal Medicine

## 2016-01-20 ENCOUNTER — Ambulatory Visit (INDEPENDENT_AMBULATORY_CARE_PROVIDER_SITE_OTHER): Payer: 59 | Admitting: Internal Medicine

## 2016-01-20 VITALS — BP 118/76 | HR 61 | Ht 70.0 in | Wt 177.4 lb

## 2016-01-20 DIAGNOSIS — I480 Paroxysmal atrial fibrillation: Secondary | ICD-10-CM

## 2016-01-20 NOTE — Progress Notes (Signed)
Electrophysiology Office Note   Date:  01/20/2016   ID:  Kevin Castro, DOB [birth year], MRN [id number]  PCP:  Horatio Pel, MD  Primary Electrophysiologist: Dr Caryl Comes  Chief Complaint  Patient presents with  . Atrial Fibrillation     History of Present Illness: Kevin Castro is a 70 y.o. male who presents today for electrophysiology follow-up.  He is doing well s/p ablation.  He has SOB for about 2 months post ablation but notes that this has Today, he denies symptoms of palpitations, chest pain, shortness of breath, orthopnea, PND, lower extremity edema, claudication, dizziness, presyncope, syncope, bleeding, or neurologic sequela. The patient is tolerating medications without difficulties and is otherwise without complaint today.    Past Medical History:  Diagnosis Date  . Atrial tachycardia ([place])   . Paroxysmal atrial fibrillation (HCC)   . Sinus bradycardia   . Trifascicular block    Past Surgical History:  Procedure Laterality Date  . DOBUTAMINE STRESS ECHO    . ELECTROPHYSIOLOGIC STUDY    . ELECTROPHYSIOLOGIC STUDY N/A 10/16/2015   Procedure: Atrial Fibrillation Ablation;  Surgeon: Thompson Grayer, MD;  Location: [facility];  Service: Cardiovascular;  Laterality: N/A;     Current Outpatient Prescriptions  Medication Sig Dispense Refill  . acetaminophen (TYLENOL) 325 MG tablet Take 650 mg by mouth daily as needed (pain). Rarely takes    . Coenzyme Q10 (CO Q 10 PO) Take 1 capsule by mouth daily.    . Glucosamine-Chondroit-Vit C-Mn (GLUCOSAMINE 1500 COMPLEX) CAPS Take 1 capsule by mouth 2 (two) times daily.     . Multiple Vitamins-Minerals (MULTIVITAMIN WITH MINERALS) tablet Take 1 tablet by mouth daily.    . Omega-3 Fatty Acids (FISH OIL) 1000 MG CAPS Take 1,000 mg by mouth 2 (two) times daily.      No current facility-administered medications for this visit.     Allergies:   Penicillins   Social History:  The patient  reports that he has quit  smoking. His smoking use included Cigarettes. He has a 5.25 pack-year smoking history. He has never used smokeless tobacco. He reports that he drinks about 7.2 oz of alcohol per week . He reports that he does not use drugs.   Family History:  The patient's  family history includes Arrhythmia in his brother, mother, and sister; Diabetes in his brother; Liver disease in his father; Lung cancer in his father; Stroke in his mother.    ROS:  Please see the history of present illness.   All other systems are reviewed and negative.    PHYSICAL EXAM: VS:  BP 118/76   Pulse 61   Ht 5\' 10"  (1.778 m)   Wt 177 lb 6.4 oz (80.5 kg)   BMI 25.45 kg/m  , BMI Body mass index is 25.45 kg/m. GEN: Well nourished, well developed, in no acute distress  HEENT: normal  Neck: no JVD, carotid bruits, or masses Cardiac: RRR with ectopy; no murmurs, rubs, or gallops,no edema  Respiratory:  clear to auscultation bilaterally, normal work of breathing GI: soft, nontender, nondistended, + BS MS: no deformity or atrophy  Skin: warm and dry  Neuro:  Strength and sensation are intact Psych: euthymic mood, full affect  EKG:  Baseline ekg reveals sinus with RBBB/LAHB,  Recent ekg confirms afib  Wt Readings from Last 3 Encounters:  01/20/16 177 lb 6.4 oz (80.5 kg)  11/10/15 177 lb 6.4 oz (80.5 kg)  10/16/15 177 lb 14.6 oz (80.7 kg)    ekg  today reveals sinus rhythm with nonconducted PACs, PVCs, RBBB/LAHB  ASSESSMENT AND PLAN:  1.  Paroxysmal atrial fibrillation No afib post ablation Heart rates are higher s/p ablation (now 60s rather than 40s) Seems to be making slow improvement chads2vasc score is 1.  He wishes to stop xarelto at this time.  2. Trifascicular block Avoid AV nodal agents I did discuss pacemaker implantation as a potential down stream evolution of his conduction system disease.  Given prior syncope, PPM implantation is justified.  He is very clear that he is not interested in PPM implantation  at this time.  Return to see me in 3 months  Signed, Thompson Grayer, MD    [facility] [address] [place] [address] [phone] (office) [phone] (fax)

## 2016-01-20 NOTE — Patient Instructions (Signed)
Medication Instructions:  Your physician has recommended you make the following change in your medication:  1) Stop Xarelto   Labwork: None ordered   Testing/Procedures: None ordered   Follow-Up: Your physician recommends that you schedule a follow-up appointment in: 3 months with Dr Allred   Any Other Special Instructions Will Be Listed Below (If Applicable).     If you need a refill on your cardiac medications before your next appointment, please call your pharmacy.   

## 2016-04-22 ENCOUNTER — Encounter: Payer: Self-pay | Admitting: Internal Medicine

## 2016-04-22 ENCOUNTER — Ambulatory Visit (INDEPENDENT_AMBULATORY_CARE_PROVIDER_SITE_OTHER): Payer: 59 | Admitting: Internal Medicine

## 2016-04-22 VITALS — BP 114/66 | HR 71 | Ht 70.0 in | Wt 183.0 lb

## 2016-04-22 DIAGNOSIS — I452 Bifascicular block: Secondary | ICD-10-CM | POA: Diagnosis not present

## 2016-04-22 DIAGNOSIS — I480 Paroxysmal atrial fibrillation: Secondary | ICD-10-CM

## 2016-04-22 NOTE — Patient Instructions (Addendum)

## 2016-04-22 NOTE — Progress Notes (Signed)
Electrophysiology Office Note   Date:  04/22/2016   ID:  Kevin Castro, DOB 02-05-1945, MRN [id number]  PCP:  Horatio Pel, MD  Primary Electrophysiologist: Dr Caryl Comes  Chief Complaint  Patient presents with  . Paroxysmal atrial fibrillation (HCC)     History of Present Illness: Kevin Castro is a 71 y.o. male who presents today for electrophysiology follow-up.  He is doing well s/p ablation.  He has had no further afib.  Energy is good and he is back to full exercise.  Today, he denies symptoms of palpitations, chest pain, shortness of breath, orthopnea, PND, lower extremity edema, claudication, dizziness, presyncope, syncope, bleeding, or neurologic sequela. The patient is tolerating medications without difficulties and is otherwise without complaint today.    Past Medical History:  Diagnosis Date  . Atrial tachycardia ([place])   . Paroxysmal atrial fibrillation (HCC)   . Sinus bradycardia   . Trifascicular block    Past Surgical History:  Procedure Laterality Date  . DOBUTAMINE STRESS ECHO    . ELECTROPHYSIOLOGIC STUDY    . ELECTROPHYSIOLOGIC STUDY N/A 10/16/2015   Procedure: Atrial Fibrillation Ablation;  Surgeon: Thompson Grayer, MD;  Location: [facility];  Service: Cardiovascular;  Laterality: N/A;     Current Outpatient Prescriptions  Medication Sig Dispense Refill  . acetaminophen (TYLENOL) 325 MG tablet Take 650 mg by mouth daily as needed (pain). Rarely takes    . Coenzyme Q10 (CO Q 10 PO) Take 1 capsule by mouth daily.    . Glucosamine-Chondroit-Vit C-Mn (GLUCOSAMINE 1500 COMPLEX) CAPS Take 1 capsule by mouth 2 (two) times daily.     . Multiple Vitamins-Minerals (MULTIVITAMIN WITH MINERALS) tablet Take 1 tablet by mouth daily.    . Omega-3 Fatty Acids (FISH OIL) 1000 MG CAPS Take 1,000 mg by mouth 2 (two) times daily.      No current facility-administered medications for this visit.     Allergies:   Penicillins   Social History:  The patient   reports that he has quit smoking. His smoking use included Cigarettes. He has a 5.25 pack-year smoking history. He has never used smokeless tobacco. He reports that he drinks about 7.2 oz of alcohol per week . He reports that he does not use drugs.   Family History:  The patient's  family history includes Arrhythmia in his brother, mother, and sister; Diabetes in his brother; Liver disease in his father; Lung cancer in his father; Stroke in his mother.    ROS:  Please see the history of present illness.   All other systems are reviewed and negative.    PHYSICAL EXAM: VS:  BP 114/66   Pulse 71   Ht 5\' 10"  (1.778 m)   Wt 183 lb (83 kg)   SpO2 98%   BMI 26.26 kg/m  , BMI Body mass index is 26.26 kg/m. GEN: Well nourished, well developed, in no acute distress  HEENT: normal  Neck: no JVD  Cardiac: RRR  Respiratory:  clear to auscultation bilaterally, normal work of breathing GI: soft, nontender, nondistended, + BS MS: no deformity or atrophy  Skin: warm and dry  Neuro:  Strength and sensation are intact Psych: euthymic mood, full affect  EKG:  Baseline ekg reveals sinus with RBBB/LAHB,  Recent ekg confirms afib  Wt Readings from Last 3 Encounters:  04/22/16 183 lb (83 kg)  01/20/16 177 lb 6.4 oz (80.5 kg)  11/10/15 177 lb 6.4 oz (80.5 kg)    ekg today reveals sinus rhythm with  nonconducted PACs, PVCs, RBBB/LAHB  ASSESSMENT AND PLAN:  1.  Paroxysmal atrial fibrillation No afib post ablation off AAD therapy Heart rates are higher s/p ablation (now 60s rather than 27s).  Though he has no symptoms from this, he worries about this.  I have reassured him today. He feels back to baseline and is pleased with results of ablation. chads2vasc score is 1.  He does not wish to take anticoagulation.  2. Trifascicular block Avoid AV nodal agents I did discuss pacemaker implantation previously and again today as a potential down stream evolution of his conduction system disease.  Given  prior syncope, PPM implantation is justified.  He is very clear that he is not interested in PPM implantation at this time.  I will return his care to Dr Caryl Comes (follow-up in 6 months) but an happy to see him again at any time.  Signed, Thompson Grayer, MD    [place] [address] [phone] (office) [phone] (fax)

## 2016-10-18 ENCOUNTER — Encounter (INDEPENDENT_AMBULATORY_CARE_PROVIDER_SITE_OTHER): Payer: Self-pay

## 2016-10-18 ENCOUNTER — Ambulatory Visit (INDEPENDENT_AMBULATORY_CARE_PROVIDER_SITE_OTHER): Payer: 59 | Admitting: Internal Medicine

## 2016-10-18 ENCOUNTER — Encounter: Payer: Self-pay | Admitting: Internal Medicine

## 2016-10-18 VITALS — BP 124/74 | HR 63 | Ht 70.0 in | Wt 183.0 lb

## 2016-10-18 DIAGNOSIS — I452 Bifascicular block: Secondary | ICD-10-CM | POA: Diagnosis not present

## 2016-10-18 DIAGNOSIS — I480 Paroxysmal atrial fibrillation: Secondary | ICD-10-CM

## 2016-10-18 NOTE — Patient Instructions (Signed)

## 2016-10-18 NOTE — Progress Notes (Signed)
Patient Care Team: Deland Pretty, MD as PCP - General (Internal Medicine)   HPI  Kevin Castro is a 71 y.o. male Seen in follow-up for atrial fib.  I saw him 2016 and he sought primary therapy with ablation which was done [facility] 2017 cx by post procedural SOB  Had syncope assoc with AFib 2017  Also RBBB/LAFB  He has had episodes of chest pain that can last fractions of minutes to hours and hours. It is pinpoint related to his left chest wall. It can be aggravated by deep breathing.  He has noted some irregularity in heart rate. This is more than skips but rather short flutters. They have been minimally symptomatic  No interval syncope  No interval atrial fibrillation  He notes his resting heart rate has remained in the low 60s following the ablation. This is in distinction to his resting rate in the high 40s low 50s prior. He has not noted any change in his maximal excursion. He has noted no change in his exercise tolerance.  Past Medical History:  Diagnosis Date  . Atrial tachycardia ([place])   . Paroxysmal atrial fibrillation (HCC)   . Sinus bradycardia   . Trifascicular block     Past Surgical History:  Procedure Laterality Date  . DOBUTAMINE STRESS ECHO    . ELECTROPHYSIOLOGIC STUDY    . ELECTROPHYSIOLOGIC STUDY N/A 10/16/2015   Procedure: Atrial Fibrillation Ablation;  Surgeon: Thompson Grayer, MD;  Location: [facility];  Service: Cardiovascular;  Laterality: N/A;    Current Outpatient Prescriptions  Medication Sig Dispense Refill  . acetaminophen (TYLENOL) 325 MG tablet Take 650 mg by mouth daily as needed (pain). Rarely takes    . Glucosamine-Chondroit-Vit C-Mn (GLUCOSAMINE 1500 COMPLEX) CAPS Take 1 capsule by mouth 2 (two) times daily.     . Multiple Vitamins-Minerals (MULTIVITAMIN WITH MINERALS) tablet Take 1 tablet by mouth daily.    . Omega-3 Fatty Acids (FISH OIL) 1000 MG CAPS Take 1,000 mg by mouth 2 (two) times daily.      No current  facility-administered medications for this visit.     Allergies  Allergen Reactions  . Penicillins Rash    Rash/blisters Has patient had a PCN reaction causing immediate rash, facial/tongue/throat swelling, SOB or lightheadedness with hypotension: Yes Has patient had a PCN reaction causing severe rash involving mucus membranes or skin necrosis: No Has patient had a PCN reaction that required hospitalization No Has patient had a PCN reaction occurring within the last 10 years: No If all of the above answers are "NO", then may proceed with Cephalosporin use.       Review of Systems negative except from HPI and PMH  Physical Exam BP 124/74   Pulse 63   Ht 5\' 10"  (1.778 m)   Wt 183 lb (83 kg)   BMI 26.26 kg/m  Well developed and well nourished in no acute distress HENT normal E scleral and icterus clear Neck Supple JVP flat; carotids brisk and full Clear to ausculation Regular rate and rhythm, no murmurs gallops or rub Soft with active bowel sounds No clubbing cyanosis  Edema Alert and oriented, grossly normal motor and sensory function Skin Warm and Dry  ECG 63 19/14/44 -67  Assessment and  Plan  Bifascicular block  Chest pain  Palpitations  Relative sinus tachycardia  The patient has had no interval syncope. The episode occurred in the context of atrial fibrillation, hence, bifascicular block while in indication for pacing probably  was not related. We have discussed the importance of letting us know about lightheadedness and we talked about the role of an implantable loop recorder in the event of presyncope and/or syncope. We discussed the physiology of ganglionic plexus ablation and is likely relationship to his relative tachycardia  Chest pain seems to be musculoskeletal. Exercise tolerance remains exceptional.  Palpitations are likely atrial ectopy and short runs. I suspect atrial fibrillation will recur.          Current medicines are reviewed at  length with the patient today .  The patient does not  have concerns regarding medicines.

## 2017-11-20 DIAGNOSIS — Z23 Encounter for immunization: Secondary | ICD-10-CM | POA: Diagnosis not present

## 2017-12-18 DIAGNOSIS — R69 Illness, unspecified: Secondary | ICD-10-CM | POA: Diagnosis not present

## 2018-02-12 DIAGNOSIS — H2513 Age-related nuclear cataract, bilateral: Secondary | ICD-10-CM | POA: Diagnosis not present

## 2018-02-12 DIAGNOSIS — H40013 Open angle with borderline findings, low risk, bilateral: Secondary | ICD-10-CM | POA: Diagnosis not present

## 2018-02-14 DIAGNOSIS — Z0100 Encounter for examination of eyes and vision without abnormal findings: Secondary | ICD-10-CM | POA: Diagnosis not present

## 2018-02-21 ENCOUNTER — Ambulatory Visit: Payer: Medicare HMO | Admitting: Internal Medicine

## 2018-02-21 ENCOUNTER — Encounter (INDEPENDENT_AMBULATORY_CARE_PROVIDER_SITE_OTHER): Payer: Self-pay

## 2018-02-21 ENCOUNTER — Encounter: Payer: Self-pay | Admitting: Internal Medicine

## 2018-02-21 VITALS — BP 122/82 | HR 64 | Ht 70.0 in | Wt 183.4 lb

## 2018-02-21 DIAGNOSIS — I471 Supraventricular tachycardia: Secondary | ICD-10-CM

## 2018-02-21 DIAGNOSIS — I452 Bifascicular block: Secondary | ICD-10-CM

## 2018-02-21 DIAGNOSIS — I480 Paroxysmal atrial fibrillation: Secondary | ICD-10-CM

## 2018-02-21 NOTE — Patient Instructions (Signed)

## 2018-02-21 NOTE — Progress Notes (Signed)
Patient Care Team: Deland Pretty, MD as PCP - General (Internal Medicine)   HPI  Kevin Castro is a 73 y.o. male Seen in follow-up for atrial fib.  I saw him 2016 and he sought primary therapy with ablation which was done by [facility] 2017 cx by post procedural SOB  Had syncope assoc with AFib 2017  Recurrent syncope last spring following a half marathon.  Long premonition.  Residual orthostatic intolerance.  Familiar.  Intermittent pleuritic chest pains.  Lasted on and off for a few days but has not recurred.  No associated dyspnea.    Also RBBB/LAFB     Past Medical History:  Diagnosis Date  . Atrial tachycardia ([place])   . Paroxysmal atrial fibrillation (HCC)   . Sinus bradycardia   . Trifascicular block     Past Surgical History:  Procedure Laterality Date  . DOBUTAMINE STRESS ECHO    . ELECTROPHYSIOLOGIC STUDY    . ELECTROPHYSIOLOGIC STUDY N/A 10/16/2015   Procedure: Atrial Fibrillation Ablation;  Surgeon: Thompson Grayer, MD;  Location: [place] [facility];  Service: Cardiovascular;  Laterality: N/A;    Current Outpatient Medications  Medication Sig Dispense Refill  . acetaminophen (TYLENOL) 325 MG tablet Take 650 mg by mouth daily as needed (pain). Rarely takes    . Glucosamine-Chondroit-Vit C-Mn (GLUCOSAMINE 1500 COMPLEX) CAPS Take 1 capsule by mouth 2 (two) times daily.     . Multiple Vitamins-Minerals (MULTIVITAMIN WITH MINERALS) tablet Take 1 tablet by mouth daily.    . Omega-3 Fatty Acids (FISH OIL) 1000 MG CAPS Take 1,000 mg by mouth 2 (two) times daily.      No current facility-administered medications for this visit.     Allergies  Allergen Reactions  . Penicillins Rash    Rash/blisters Has patient had a PCN reaction causing immediate rash, facial/tongue/throat swelling, SOB or lightheadedness with hypotension: Yes Has patient had a PCN reaction causing severe rash involving mucus membranes or skin necrosis: No Has patient had a PCN reaction that  required hospitalization No Has patient had a PCN reaction occurring within the last 10 years: No If all of the above answers are "NO", then may proceed with Cephalosporin use.       Review of Systems negative except from HPI and PMH  Physical Exam BP 122/82   Pulse 64   Ht 5\' 10"  (1.778 m)   Wt 183 lb 6.4 oz (83.2 kg)   SpO2 97%   BMI 26.32 kg/m  Well developed and nourished in no acute distress HENT normal Neck supple with JVP-flat Clear Regular rate and rhythm, no murmurs or gallops Abd-soft with active BS No Clubbing cyanosis edema Skin-warm and dry A & Oriented  Grossly normal sensory and motor function    ECG sinus at 54 with PACs  interval 21/14/44 5 Axis left -80 Right bundle branch block left axis deviation  Assessment and  Plan  Bifascicular block  Chest pain-pleuritic  Palpitations  Syncope-neurally mediated    Patient has had recurrent syncope most consistent with neurally mediated triggered by half marathon and prolonged post exercise standing.  I reviewed the physiology and the importance of recognizing and responding to the prodrome  Palpitations are most consistent with PACs.  ECG supports that hypothesis.  No atrial fibrillation of which he is aware.  Chest pain most consistent with pleuritic.  No shortness of breath.  May well have been a viral pleurisy.  Will follow.  We spent more than 50% of our >  25 min visit in face to face counseling regarding the above       Current medicines are reviewed at length with the patient today .  The patient does not  have concerns regarding medicines.

## 2018-07-03 DIAGNOSIS — E875 Hyperkalemia: Secondary | ICD-10-CM | POA: Diagnosis not present

## 2018-07-03 DIAGNOSIS — Z0000 Encounter for general adult medical examination without abnormal findings: Secondary | ICD-10-CM | POA: Diagnosis not present

## 2018-07-10 DIAGNOSIS — I453 Trifascicular block: Secondary | ICD-10-CM | POA: Diagnosis not present

## 2018-07-10 DIAGNOSIS — Z8679 Personal history of other diseases of the circulatory system: Secondary | ICD-10-CM | POA: Diagnosis not present

## 2018-07-10 DIAGNOSIS — Z125 Encounter for screening for malignant neoplasm of prostate: Secondary | ICD-10-CM | POA: Diagnosis not present

## 2018-07-10 DIAGNOSIS — Z0000 Encounter for general adult medical examination without abnormal findings: Secondary | ICD-10-CM | POA: Diagnosis not present

## 2018-07-10 DIAGNOSIS — R079 Chest pain, unspecified: Secondary | ICD-10-CM | POA: Diagnosis not present

## 2018-07-10 DIAGNOSIS — B078 Other viral warts: Secondary | ICD-10-CM | POA: Diagnosis not present

## 2018-07-10 DIAGNOSIS — Z20828 Contact with and (suspected) exposure to other viral communicable diseases: Secondary | ICD-10-CM | POA: Diagnosis not present

## 2018-07-10 DIAGNOSIS — N402 Nodular prostate without lower urinary tract symptoms: Secondary | ICD-10-CM | POA: Diagnosis not present

## 2018-08-02 ENCOUNTER — Telehealth: Payer: Self-pay

## 2018-08-02 NOTE — Telephone Encounter (Signed)
I called and left patient a message about upcoming appointment on 08/08/18. Does patient want to keep appointment as telephone call or come into the office.

## 2018-08-07 NOTE — Telephone Encounter (Signed)
    COVID-19 Pre-Screening Questions:  . In the past 7 to 10 days have you had a cough,  shortness of breath, headache, congestion, fever (100 or greater) body aches, chills, sore throat, or sudden loss of taste or sense of smell? . Have you been around anyone with known Covid 19. . Have you been around anyone who is awaiting Covid 19 test results in the past 7 to 10 days? . Have you been around anyone who has been exposed to Covid 19, or has mentioned symptoms of Covid 19 within the past 7 to 10 days?  If you have any concerns/questions about symptoms patients report during screening (either on the phone or at threshold). Contact the provider seeing the patient or DOD for further guidance.  If neither are available contact a member of the leadership team.       I called and spoke with patient, he answered no to the above questions. 

## 2018-08-08 ENCOUNTER — Telehealth: Payer: Medicare HMO | Admitting: Internal Medicine

## 2018-08-08 ENCOUNTER — Other Ambulatory Visit: Payer: Self-pay

## 2018-08-08 ENCOUNTER — Ambulatory Visit: Payer: Medicare HMO | Admitting: Internal Medicine

## 2018-08-08 ENCOUNTER — Encounter: Payer: Self-pay | Admitting: Internal Medicine

## 2018-08-08 VITALS — BP 108/62 | HR 66 | Ht 70.0 in | Wt 184.0 lb

## 2018-08-08 DIAGNOSIS — I480 Paroxysmal atrial fibrillation: Secondary | ICD-10-CM | POA: Diagnosis not present

## 2018-08-08 DIAGNOSIS — I452 Bifascicular block: Secondary | ICD-10-CM

## 2018-08-08 NOTE — Patient Instructions (Signed)

## 2018-08-08 NOTE — Progress Notes (Signed)
Patient Care Team: Deland Pretty, MD as PCP - General (Internal Medicine)   HPI  Kevin Castro is a 73 y.o. male Seen in follow-up for atrial fib.  I saw him 2016 and he sought primary therapy with ablation which was done by [facility] 2017 cx by post procedural SOB  Had syncope assoc with AFib 2017  Recurrent syncope last spring following a half marathon.  Long premonition.  Residual orthostatic intolerance.  Familiar.   recent afib while riding his bike,  Looked at [employer] tracings strongly supportive of the pts clinical impression  Otherwise well   Thromboembolic risk factors ( age -50) for a CHADSVASc Score of 1  Episode in question is 1 hr of duration See Below    Also RBBB/LAFB     Past Medical History:  Diagnosis Date  . Atrial tachycardia ([place])   . Paroxysmal atrial fibrillation (H[facility])   . Sinus bradycardia   . Trifascicular block     Past Surgical History:  Procedure Laterality Date  . DOBUTAMINE STRESS ECHO    . ELECTROPHYSIOLOGIC STUDY    . ELECTROPHYSIOLOGIC STUDY N/A 10/16/2015   Procedure: Atrial Fibrillation Ablation;  Surgeon: Thompson Grayer, MD;  Location: [place] [facility];  Service: Cardiovascular;  Laterality: N/A;    Current Outpatient Medications  Medication Sig Dispense Refill  . acetaminophen (TYLENOL) 325 MG tablet Take 650 mg by mouth daily as needed (pain). Rarely takes    . Glucosamine-Chondroit-Vit C-Mn (GLUCOSAMINE 1500 COMPLEX) CAPS Take 1 capsule by mouth 2 (two) times daily.     . Multiple Vitamins-Minerals (MULTIVITAMIN WITH MINERALS) tablet Take 1 tablet by mouth daily.    . Omega-3 Fatty Acids (FISH OIL) 1000 MG CAPS Take 1,000 mg by mouth 2 (two) times daily.      No current facility-administered medications for this visit.     Allergies  Allergen Reactions  . Penicillins Rash    Rash/blisters Has patient had a PCN reaction causing immediate rash, facial/tongue/throat swelling, SOB or lightheadedness with hypotension: Yes Has  patient had a PCN reaction causing severe rash involving mucus membranes or skin necrosis: No Has patient had a PCN reaction that required hospitalization No Has patient had a PCN reaction occurring within the last 10 years: No If all of the above answers are "NO", then may proceed with Cephalosporin use.       Review of Systems negative except from HPI and PMH  Physical Exam BP 108/62   Pulse 66   Ht 5\' 10"  (1.778 m)   Wt 184 lb (83.5 kg)   BMI 26.40 kg/m  Well developed and nourished in no acute distress HENT normal Neck supple with JVP-  Flat  Clear Regular rate and rhythm, no murmurs or gallops Abd-soft with active BS No Clubbing cyanosis edema Skin-warm and dry A & Oriented  Grossly normal sensory and motor function  ECG sinus with bifascicular block and freq PACs and atrial couplets   Assessment and  Plan  Bifascicular block  Chest pain-pleuritic  Palpitations--afib and frequent atrial ectopy   Syncope-neurally mediated   No interval syncope  Recurrent atrial fib--strongly suggestive based on [employer] tracings Personally reviewed  Today Reviewed ECG with pt--  For now will continue current course-- not enthused about the thought of another ablation  Based on the brevity of his event and the ASSERT trial, suggested taht he does not need anticoagulation at this time       Current medicines are reviewed at length  with the patient today .  The patient does not  have concerns regarding medicines.

## 2018-08-09 DIAGNOSIS — H25813 Combined forms of age-related cataract, bilateral: Secondary | ICD-10-CM | POA: Diagnosis not present

## 2018-08-09 DIAGNOSIS — H40023 Open angle with borderline findings, high risk, bilateral: Secondary | ICD-10-CM | POA: Diagnosis not present

## 2018-08-20 DIAGNOSIS — M542 Cervicalgia: Secondary | ICD-10-CM | POA: Diagnosis not present

## 2018-08-27 DIAGNOSIS — M5412 Radiculopathy, cervical region: Secondary | ICD-10-CM | POA: Diagnosis not present

## 2018-08-30 DIAGNOSIS — M5412 Radiculopathy, cervical region: Secondary | ICD-10-CM | POA: Diagnosis not present

## 2018-09-03 DIAGNOSIS — M5412 Radiculopathy, cervical region: Secondary | ICD-10-CM | POA: Diagnosis not present

## 2018-09-05 DIAGNOSIS — M5412 Radiculopathy, cervical region: Secondary | ICD-10-CM | POA: Diagnosis not present

## 2018-09-10 DIAGNOSIS — M5412 Radiculopathy, cervical region: Secondary | ICD-10-CM | POA: Diagnosis not present

## 2018-09-12 DIAGNOSIS — R972 Elevated prostate specific antigen [PSA]: Secondary | ICD-10-CM | POA: Diagnosis not present

## 2018-09-12 DIAGNOSIS — N402 Nodular prostate without lower urinary tract symptoms: Secondary | ICD-10-CM | POA: Diagnosis not present

## 2018-09-12 DIAGNOSIS — M5412 Radiculopathy, cervical region: Secondary | ICD-10-CM | POA: Diagnosis not present

## 2018-09-17 DIAGNOSIS — M5412 Radiculopathy, cervical region: Secondary | ICD-10-CM | POA: Diagnosis not present

## 2018-09-20 DIAGNOSIS — M5412 Radiculopathy, cervical region: Secondary | ICD-10-CM | POA: Diagnosis not present

## 2018-09-26 DIAGNOSIS — R69 Illness, unspecified: Secondary | ICD-10-CM | POA: Diagnosis not present

## 2018-09-28 DIAGNOSIS — R69 Illness, unspecified: Secondary | ICD-10-CM | POA: Diagnosis not present

## 2018-11-07 DIAGNOSIS — C61 Malignant neoplasm of prostate: Secondary | ICD-10-CM | POA: Diagnosis not present

## 2018-11-07 DIAGNOSIS — N402 Nodular prostate without lower urinary tract symptoms: Secondary | ICD-10-CM | POA: Diagnosis not present

## 2018-11-13 ENCOUNTER — Other Ambulatory Visit (HOSPITAL_COMMUNITY): Payer: Self-pay | Admitting: Urology

## 2018-11-13 DIAGNOSIS — C61 Malignant neoplasm of prostate: Secondary | ICD-10-CM

## 2018-11-15 DIAGNOSIS — C61 Malignant neoplasm of prostate: Secondary | ICD-10-CM | POA: Diagnosis not present

## 2018-11-15 DIAGNOSIS — R972 Elevated prostate specific antigen [PSA]: Secondary | ICD-10-CM | POA: Diagnosis not present

## 2018-11-15 DIAGNOSIS — Z8546 Personal history of malignant neoplasm of prostate: Secondary | ICD-10-CM | POA: Diagnosis not present

## 2018-11-16 DIAGNOSIS — Z23 Encounter for immunization: Secondary | ICD-10-CM | POA: Diagnosis not present

## 2018-11-21 ENCOUNTER — Encounter (HOSPITAL_COMMUNITY)
Admission: RE | Admit: 2018-11-21 | Discharge: 2018-11-21 | Disposition: A | Discharge status: Home / Self Care | Payer: Medicare HMO | Source: Ambulatory Visit | Attending: Urology | Admitting: Urology

## 2018-11-21 ENCOUNTER — Other Ambulatory Visit: Payer: Self-pay

## 2018-11-21 DIAGNOSIS — C61 Malignant neoplasm of prostate: Secondary | ICD-10-CM | POA: Diagnosis not present

## 2018-11-21 MED ORDER — TECHNETIUM TC 99M MEDRONATE IV KIT
20.9000 | PACK | Freq: Once | INTRAVENOUS | Status: AC
  Administered 2018-11-21: 20.9 via INTRAVENOUS

## 2018-11-26 ENCOUNTER — Encounter: Payer: Self-pay | Admitting: *Deleted

## 2018-11-26 NOTE — Progress Notes (Signed)
GU Location of Tumor / Histology:   If Prostate Cancer, Gleason Score is (4 + 3) and PSA is (4.22)  Kevin Castro presented  months ago with signs/symptoms of: just checking psa  Biopsies of prostate (if applicable) revealed: adenocarcinoma  Past/Anticipated interventions by urology, if any: biopsy of prostate  Past/Anticipated interventions by medical oncology, if any: no  Weight changes, if any: no  Bowel/Bladder complaints, if any: no  Nausea/Vomiting, if any: no  Pain issues, if any:  no  SAFETY ISSUES:  Prior radiation? no  Pacemaker/ICD? no  Possible current pregnancy? male  Is the patient on methotrexate? no  Current Complaints / other details:  Patient has multiple questions about different types of treatments for his cancer. We discussed external beam radiation and some of the reasons they don't do surgery to the prostate after radiation and why radiation is only done once due to other structures. He was curious about proton beam and directed the rest of his questions to Dr Kevin Castro. He sees the surgeon next week. Brandt Loosen RN

## 2018-11-27 ENCOUNTER — Ambulatory Visit
Admission: RE | Admit: 2018-11-27 | Discharge: 2018-11-27 | Disposition: A | Discharge status: Home / Self Care | Payer: Medicare HMO | Source: Ambulatory Visit | Attending: Radiation Oncology | Admitting: Radiation Oncology

## 2018-11-27 ENCOUNTER — Other Ambulatory Visit: Payer: Self-pay

## 2018-11-27 ENCOUNTER — Encounter: Payer: Self-pay | Admitting: Radiation Oncology

## 2018-11-27 ENCOUNTER — Other Ambulatory Visit (HOSPITAL_COMMUNITY): Payer: Self-pay | Admitting: Urology

## 2018-11-27 ENCOUNTER — Ambulatory Visit (HOSPITAL_COMMUNITY)
Admission: RE | Admit: 2018-11-27 | Discharge: 2018-11-27 | Disposition: A | Discharge status: Home / Self Care | Payer: Medicare HMO | Source: Ambulatory Visit | Attending: Urology | Admitting: Urology

## 2018-11-27 DIAGNOSIS — C61 Malignant neoplasm of prostate: Secondary | ICD-10-CM | POA: Diagnosis not present

## 2018-11-27 DIAGNOSIS — R972 Elevated prostate specific antigen [PSA]: Secondary | ICD-10-CM | POA: Diagnosis not present

## 2018-11-27 DIAGNOSIS — M19011 Primary osteoarthritis, right shoulder: Secondary | ICD-10-CM | POA: Diagnosis not present

## 2018-11-27 HISTORY — DX: Malignant neoplasm of prostate: C61

## 2018-11-27 NOTE — Progress Notes (Signed)
Radiation Oncology         [phone] ________________________________  Initial outpatient Consultation - Conducted via Telephone due to current COVID-19 concerns for limiting patient exposure  Name: Kevin Castro MRN: [id number]  Date: 11/27/2018  DOB: 1945-10-27  [id number], Thayer Jew, MD  Franchot Gallo, MD   REFERRING PHYSICIAN: Franchot Gallo, MD  DIAGNOSIS: 73 y.o. 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    ICD-10-CM   1. Prostate CA [facility])  C61     HISTORY OF PRESENT ILLNESS: Kevin Castro is a 73 y.o. male with a diagnosis of prostate cancer. He has a history of a prostate nodule, first noted in 07/1996. He was seen at [facility] by Dr. Risa Grill in 2008, who noted an approximately 1 cm nodule at the left lateral apex. Since his PSA remained normal, he has continued on active PSA surveillance. Most recently, however, his PSA went from 2.3 in 05/2016 to 3.6 in 07/2017, and now to 4.22 in 07/2018. The patient proceeded to transrectal ultrasound with 12 biopsies of the prostate on 11/07/2018.  The prostate volume measured 55.34 cc.  Out of 12 core biopsies, 8 were positive.  The maximum Gleason score was 4+4, and this was seen in left and right base, both with perineural invasion. Gleason 4+3 was seen in left mid lateral (with PNI), left apex lateral (with PNI), left mid (with PNI), and left apex. Gleason 3+4 was seen in left base lateral (with PNI) and Gleason 3+3 in right base lateral.  He underwent abdomen/pelvis CT on 11/15/2018, which showed no signs of metastatic disease.   Bone scan was performed on 11/21/2018 and showed: indeterminate uptake at the posterior right shoulder; no other concerning sites of abnormal osseous uptake.  The patient reviewed the biopsy results with his urologist and he has kindly been referred today for discussion of potential radiation treatment options.   PREVIOUS RADIATION THERAPY: No   PAST MEDICAL HISTORY:  Past Medical History:  Diagnosis Date  . Atrial tachycardia ([place])   . Paroxysmal atrial fibrillation (HCC)   . Prostate CA ([place])   . Sinus bradycardia   . Trifascicular block       PAST SURGICAL HISTORY: Past Surgical History:  Procedure Laterality Date  . DOBUTAMINE STRESS ECHO    . ELECTROPHYSIOLOGIC STUDY    . ELECTROPHYSIOLOGIC STUDY N/A 10/16/2015   Procedure: Atrial Fibrillation Ablation;  Surgeon: Thompson Grayer, MD;  Location: [place] [facility];  Service: Cardiovascular;  Laterality: N/A;    FAMILY HISTORY:  Family History  Problem Relation Age of Onset  . Arrhythmia Mother   . Stroke Mother        30  . Lung cancer Father   . Liver disease Father   . Arrhythmia Sister   . Arrhythmia Brother   . Diabetes Brother        oldest brother  . Heart attack Neg Hx   . Hypertension Neg Hx     SOCIAL HISTORY:  Social History   Socioeconomic History  . Marital status: Married    Spouse name: Not on file  . Number of children: Not on file  . Years of education: Not on file  . Highest education level: Not on file  Occupational History  . Not on file  Social Needs  . Financial resource strain: Not on file  . Food insecurity    Worry: Not on file    Inability: Not on file  .  Transportation needs    Medical: Not on file    Non-medical: Not on file  Tobacco Use  . Smoking status: Former Smoker    Packs/day: 0.75    Years: 7.00    Pack years: 5.25    Types: Cigarettes  . Smokeless tobacco: Never Used  Substance and Sexual Activity  . Alcohol use: Yes    Alcohol/week: 12.0 standard drinks    Types: 12 Cans of beer per week    Comment: "can go through a couple six packs on the weekend"  . Drug use: No  . Sexual activity: Not on file  Lifestyle  . Physical activity    Days per week: Not on file    Minutes per session: Not on file  . Stress: Not on file  Relationships  . Social Herbalist on phone: Not on file    Gets  together: Not on file    Attends religious service: Not on file    Active member of club or organization: Not on file    Attends meetings of clubs or organizations: Not on file    Relationship status: Not on file  . Intimate partner violence    Fear of current or ex partner: Not on file    Emotionally abused: Not on file    Physically abused: Not on file    Forced sexual activity: Not on file  Other Topics Concern  . Not on file  Social History Narrative  . Not on file    ALLERGIES: Penicillins  MEDICATIONS:  Current Outpatient Medications  Medication Sig Dispense Refill  . acetaminophen (TYLENOL) 325 MG tablet Take 650 mg by mouth daily as needed (pain). Rarely takes    . Glucosamine-Chondroit-Vit C-Mn (GLUCOSAMINE 1500 COMPLEX) CAPS Take 1 capsule by mouth 2 (two) times daily.     . Multiple Vitamins-Minerals (MULTIVITAMIN WITH MINERALS) tablet Take 1 tablet by mouth daily.    . Omega-3 Fatty Acids (FISH OIL) 1000 MG CAPS Take 1,000 mg by mouth 2 (two) times daily.      No current facility-administered medications for this encounter.     REVIEW OF SYSTEMS:  On review of systems, the patient reports that he is doing well overall. He denies any chest pain, shortness of breath, cough, fevers, chills, night sweats, unintended weight changes. He denies any bowel disturbances, and denies abdominal pain, nausea or vomiting. He denies any new musculoskeletal or joint aches or pains. His IPSS and SHIM were recorded. A complete review of systems is obtained and is otherwise negative.    PHYSICAL EXAM:  Wt Readings from Last 3 Encounters:  08/08/18 184 lb (83.5 kg)  02/21/18 183 lb 6.4 oz (83.2 kg)  10/18/16 183 lb (83 kg)   Temp Readings from Last 3 Encounters:  10/17/15 98 F (36.7 C) (Oral)  10/06/14 98.4 F (36.9 C) (Oral)   BP Readings from Last 3 Encounters:  08/08/18 108/62  02/21/18 122/82  10/18/16 124/74   Pulse Readings from Last 3 Encounters:  08/08/18 66   02/21/18 64  10/18/16 63   Pain Assessment Pain Score: 0-No pain/10  In general this is a well appearing man in no acute distress. He's alert and oriented x4 and appropriate throughout the examination. Cardiopulmonary assessment is negative for acute distress and he exhibits normal effort.    KPS = 100  100 - Normal; no complaints; no evidence of disease. 90   - Able to carry on normal activity; minor signs or  symptoms of disease. 80   - Normal activity with effort; some signs or symptoms of disease. 55   - Cares for self; unable to carry on normal activity or to do active work. 60   - Requires occasional assistance, but is able to care for most of his personal needs. 50   - Requires considerable assistance and frequent medical care. 34   - Disabled; requires special care and assistance. 2   - Severely disabled; hospital admission is indicated although death not imminent. 90   - Very sick; hospital admission necessary; active supportive treatment necessary. 10   - Moribund; fatal processes progressing rapidly. 0     - Dead  Karnofsky DA, Abelmann [place], Craver LS and Burchenal [facility] [phone]) The use of the nitrogen mustards in the palliative treatment of carcinoma: with particular reference to bronchogenic carcinoma Cancer 1 634-56  LABORATORY DATA:  Lab Results  Component Value Date   WBC 4.6 10/02/2015   HGB 14.1 10/02/2015   HCT 40.9 10/02/2015   MCV 94.7 10/02/2015   PLT 198 10/02/2015   Lab Results  Component Value Date   NA 140 10/02/2015   K 4.5 10/02/2015   CL 104 10/02/2015   CO2 28 10/02/2015   No results found for: ALT, AST, GGT, ALKPHOS, BILITOT   RADIOGRAPHY: Nm Bone Scan Whole Body  Result Date: 11/21/2018 CLINICAL DATA:  Prostate cancer, PSA = 4.22 EXAM: NUCLEAR MEDICINE WHOLE BODY BONE SCAN TECHNIQUE: Whole body anterior and posterior images were obtained approximately 3 hours after intravenous injection of radiopharmaceutical. RADIOPHARMACEUTICALS:  20.9 mCi  Technetium-26m MDP IV COMPARISON:  None Radiographic correlation: 11/15/2018 FINDINGS: Uptake at RIGHT [facility] joint, typically degenerative. However, slightly more pronounced uptake of tracer is seen at the posterior RIGHT acromion, nonspecific, cannot exclude metastatic disease. Uptake at the LEFT lateral aspect of the cervical spine, typically degenerative. Uptake at sternoclavicular joints, knees, RIGHT ankle, LEFT first MTP joint, typically degenerative. No additional worrisome sites of abnormal tracer localization are seen. Minimal increased tracer localization at the lateral aspects of the greater trochanters bilaterally, could be related to trauma or bursitis. Expected urinary tract and soft tissue distribution of tracer. IMPRESSION: Uptake at the posterior RIGHT shoulder at the posterior acromion, could be related to degenerative changes such as from a chronic rotator cuff tear but metastatic disease is not excluded; dedicated RIGHT shoulder radiographs recommended. No other concerning sites of abnormal osseous uptake are identified. Electronically Signed   By: Lavonia Dana M.D.   On: 11/21/2018 17:37      IMPRESSION/PLAN: 1. 73 y.o. gentleman with Stage T2a adenocarcinoma of the prostate with Gleason Score of 4+4, and PSA of 4.22. This visit was conducted via Telephone to spare the patient unnecessary potential exposure in the healthcare setting during the current COVID-19 pandemic. We discussed the patient's workup and outlined the nature of prostate cancer in this setting. The patient's T stage, Gleason's score, and PSA put him into the high risk group. Accordingly, he is eligible for a variety of potential treatment options including LT-ADT in combination with 8 weeks of external radiation or 5 weeks of external radiation preceded by a brachytherapy boost. We discussed the available radiation techniques, and focused on the details and logistics and delivery. We discussed and outlined the risks,  benefits, short and long-term effects associated with radiotherapy and compared and contrasted these with prostatectomy. We discussed the role of SpaceOAR in reducing the rectal toxicity associated with radiotherapy. We also detailed the role of ADT  in the treatment of high risk prostate cancer and outlined the associated side effects that could be expected with this therapy.  At the end of the conversation the patient is interested in taking time to consider options.   Given current concerns for patient exposure during the COVID-19 pandemic, this encounter was conducted via  telephone. The patient was notified in advance and was offered a MyChart meeting to allow for face to face communication but unfortunately reported that he did not have the appropriate resources/technology to support such a visit and instead preferred to proceed with telephone consult. The patient has given verbal consent for this type of encounter. The time spent during this encounter was 60 minutes. The attendants for this meeting include Tyler Pita MD, [place], patient Ofilia Neas During the encounter, Tyler Pita MD and scribe, Wilburn Mylar were located at [facility] Radiation Oncology Department.  Patient Sneyder Spuhler was located at home and moved to his car at 10 am to drive somewhere while continuing to talk.   ------------------------------------------------   Tyler Pita, MD [place] Director and Director of Stereotactic Radiosurgery Direct Dial: [phone]  Fax: [phone] [email or web address]  Skype  [email or web address]   This document serves as a record of services personally performed by Tyler Pita, MD. It was created on his behalf by Wilburn Mylar, a trained medical scribe. The creation of this record is based on the scribe's personal observations and the provider's statements to them. This document has been checked and  approved by the attending provider.

## 2018-11-27 NOTE — Patient Instructions (Signed)
Coronavirus (COVID-19) Are you at risk?  Are you at risk for the Coronavirus (COVID-19)?  To be considered HIGH RISK for Coronavirus (COVID-19), you have to meet the following criteria:  . Traveled to [place], [place], [place], [place] or [place]; or in the [place] to [place], [place], [place], or [place]; and have fever, cough, and shortness of breath within the last 2 weeks of travel OR . Been in close contact with a person diagnosed with COVID-19 within the last 2 weeks and have fever, cough, and shortness of breath . IF YOU DO NOT MEET THESE CRITERIA, YOU ARE CONSIDERED LOW RISK FOR COVID-19.  What to do if you are HIGH RISK for COVID-19?  . If you are having a medical emergency, call 911. . Seek medical care right away. Before you go to a doctor's office, urgent care or emergency department, call ahead and tell them about your recent travel, contact with someone diagnosed with COVID-19, and your symptoms. You should receive instructions from your physician's office regarding next steps of care.  . When you arrive at healthcare provider, tell the healthcare staff immediately you have returned from visiting [place], [place], [place], [place] or [place]; or traveled in the [place] to [place], [place], [place], or [place]; in the last two weeks or you have been in close contact with a person diagnosed with COVID-19 in the last 2 weeks.   . Tell the health care staff about your symptoms: fever, cough and shortness of breath. . After you have been seen by a medical provider, you will be either: o Tested for (COVID-19) and discharged home on quarantine except to seek medical care if symptoms worsen, and asked to  - Stay home and avoid contact with others until you get your results (4-5 days)  - Avoid travel on public transportation if possible (such as bus, train, or airplane) or o Sent to the Emergency Department by EMS for evaluation, COVID-19 testing, and possible  admission depending on your condition and test results.  What to do if you are LOW RISK for COVID-19?  Reduce your risk of any infection by using the same precautions used for avoiding the common cold or flu:  . Wash your hands often with soap and warm water for at least 20 seconds.  If soap and water are not readily available, use an alcohol-based hand sanitizer with at least 60% alcohol.  . If coughing or sneezing, cover your mouth and nose by coughing or sneezing into the elbow areas of your shirt or coat, into a tissue or into your sleeve (not your hands). . Avoid shaking hands with others and consider head nods or verbal greetings only. . Avoid touching your eyes, nose, or mouth with unwashed hands.  . Avoid close contact with people who are sick. . Avoid places or events with large numbers of people in one location, like concerts or sporting events. . Carefully consider travel plans you have or are making. . If you are planning any travel outside or inside the [place], visit the CDC's Travelers' Health webpage for the latest health notices. . If you have some symptoms but not all symptoms, continue to monitor at home and seek medical attention if your symptoms worsen. . If you are having a medical emergency, call 911.   ADDITIONAL HEALTHCARE OPTIONS FOR PATIENTS  [facility] / e-Visit: [email or web address]         [facility]: [phone]  [facility]: [phone]                   [facility]: [phone]   

## 2018-11-29 ENCOUNTER — Telehealth: Payer: Self-pay | Admitting: Medical Oncology

## 2018-11-29 NOTE — Progress Notes (Signed)
Spoke with Mr. Kevin Castro, to introduce myself as the prostate nurse navigator and discuss my role. I was unable to meet him 10/27, when he consulted with Dr. Tammi Klippel. He is considering LT-ADT, seed boost with external beam. We discussed the treatment and questions were answered. He has a consult with Dr. Alinda Money, 11/3 to discuss prostatectomy. He does not think surgery will be his choice, but I encouraged him to learn about it before making his final decision. He was very appreciative of my call and I asked him to call me back with questions or concerns.

## 2018-12-04 DIAGNOSIS — C61 Malignant neoplasm of prostate: Secondary | ICD-10-CM | POA: Diagnosis not present

## 2018-12-10 ENCOUNTER — Telehealth: Payer: Self-pay | Admitting: Medical Oncology

## 2018-12-10 NOTE — Telephone Encounter (Signed)
Patient called stating he met with Dr. Alinda Money, 12/04/18 to discuss surgery. He states he has been doing research and would like to be referred to someone that does robotic prostatectomy. I explained robotic prostatectomy is what Dr. Alinda Money does. He heard laparoscopic and thought it meant an incision. I reviewed with him how they do robotic prostatectomy and if there are complications, it could lead to an incision. He misunderstood and was going to ask for a referral to [facility] or [facility]. I asked if he would like for Dr. Alinda Money, to reach out to him to discuss further and he would. I forwarded a note to Dr. Alinda Money with the above. I asked patient to call with further questions or concerns. He voiced understanding.

## 2018-12-13 DIAGNOSIS — C61 Malignant neoplasm of prostate: Secondary | ICD-10-CM | POA: Diagnosis not present

## 2018-12-17 DIAGNOSIS — C61 Malignant neoplasm of prostate: Secondary | ICD-10-CM | POA: Diagnosis not present

## 2018-12-17 DIAGNOSIS — R8290 Unspecified abnormal findings in urine: Secondary | ICD-10-CM | POA: Diagnosis not present

## 2018-12-19 DIAGNOSIS — G8929 Other chronic pain: Secondary | ICD-10-CM | POA: Insufficient documentation

## 2018-12-19 DIAGNOSIS — M542 Cervicalgia: Secondary | ICD-10-CM | POA: Diagnosis not present

## 2018-12-19 DIAGNOSIS — M5030 Other cervical disc degeneration, unspecified cervical region: Secondary | ICD-10-CM | POA: Diagnosis not present

## 2018-12-29 DIAGNOSIS — M542 Cervicalgia: Secondary | ICD-10-CM | POA: Diagnosis not present

## 2018-12-31 DIAGNOSIS — C61 Malignant neoplasm of prostate: Secondary | ICD-10-CM | POA: Diagnosis not present

## 2019-01-07 ENCOUNTER — Telehealth: Payer: Self-pay | Admitting: Medical Oncology

## 2019-01-07 NOTE — Telephone Encounter (Signed)
Patient called asking how long would it take to be scheduled for surgery if he decides on prostatectomy. I explained that Dr. Alinda Money will need to see him in a pre-op consult before he would be scheduled for surgery. He went to [facility] for a 2nd opinion and had a MRI but has not received results. Results will help him make his final decision whether he should get radiation or surgery. I told him I will be happy to let Dr. Alinda Money know his decision once he gets results. He voiced understanding.

## 2019-01-08 DIAGNOSIS — M542 Cervicalgia: Secondary | ICD-10-CM | POA: Diagnosis not present

## 2019-01-09 DIAGNOSIS — N402 Nodular prostate without lower urinary tract symptoms: Secondary | ICD-10-CM | POA: Diagnosis not present

## 2019-01-10 DIAGNOSIS — C61 Malignant neoplasm of prostate: Secondary | ICD-10-CM | POA: Insufficient documentation

## 2019-01-11 DIAGNOSIS — T839XXA Unspecified complication of genitourinary prosthetic device, implant and graft, initial encounter: Secondary | ICD-10-CM | POA: Diagnosis not present

## 2019-01-11 DIAGNOSIS — C61 Malignant neoplasm of prostate: Secondary | ICD-10-CM | POA: Diagnosis not present

## 2019-01-11 DIAGNOSIS — Y838 Other surgical procedures as the cause of abnormal reaction of the patient, or of later complication, without mention of misadventure at the time of the procedure: Secondary | ICD-10-CM | POA: Diagnosis not present

## 2019-01-18 DIAGNOSIS — Z79818 Long term (current) use of other agents affecting estrogen receptors and estrogen levels: Secondary | ICD-10-CM | POA: Diagnosis not present

## 2019-01-18 DIAGNOSIS — C61 Malignant neoplasm of prostate: Secondary | ICD-10-CM | POA: Diagnosis not present

## 2019-01-31 ENCOUNTER — Other Ambulatory Visit: Payer: Self-pay

## 2019-01-31 ENCOUNTER — Ambulatory Visit: Payer: Medicare HMO | Attending: Internal Medicine

## 2019-01-31 DIAGNOSIS — Z20822 Contact with and (suspected) exposure to covid-19: Secondary | ICD-10-CM

## 2019-01-31 DIAGNOSIS — Z20828 Contact with and (suspected) exposure to other viral communicable diseases: Secondary | ICD-10-CM | POA: Diagnosis not present

## 2019-02-02 LAB — NOVEL CORONAVIRUS, NAA: SARS-CoV-2, NAA: NOT DETECTED

## 2019-02-12 DIAGNOSIS — H2513 Age-related nuclear cataract, bilateral: Secondary | ICD-10-CM | POA: Diagnosis not present

## 2019-02-12 DIAGNOSIS — H52203 Unspecified astigmatism, bilateral: Secondary | ICD-10-CM | POA: Diagnosis not present

## 2019-02-12 DIAGNOSIS — H524 Presbyopia: Secondary | ICD-10-CM | POA: Diagnosis not present

## 2019-02-12 DIAGNOSIS — H40023 Open angle with borderline findings, high risk, bilateral: Secondary | ICD-10-CM | POA: Diagnosis not present

## 2019-02-20 ENCOUNTER — Ambulatory Visit: Payer: Medicare Other | Attending: Internal Medicine

## 2019-02-20 DIAGNOSIS — Z23 Encounter for immunization: Secondary | ICD-10-CM | POA: Insufficient documentation

## 2019-02-20 NOTE — Progress Notes (Signed)
   Covid-19 Vaccination Clinic  Name:  Tyller Sartini    MRN: [id number] DOB: [birth year]  02/20/2019  Mr. Wileman was observed post Covid-19 immunization for 15 minutes without incidence. He was provided with Vaccine Information Sheet and instruction to access the V-Safe system.   Mr. Verdejo was instructed to call 911 with any severe reactions post vaccine: Marland Kitchen Difficulty breathing  . Swelling of your face and throat  . A fast heartbeat  . A bad rash all over your body  . Dizziness and weakness    Immunizations Administered    Name Date Dose VIS Date Route   Pfizer COVID-19 Vaccine 02/20/2019 10:12 AM 0.3 mL 01/11/2019 Intramuscular   Manufacturer: [employer]   Lot: [id number]   [place]: [id number]

## 2019-02-21 DIAGNOSIS — R69 Illness, unspecified: Secondary | ICD-10-CM | POA: Diagnosis not present

## 2019-02-25 DIAGNOSIS — T839XXA Unspecified complication of genitourinary prosthetic device, implant and graft, initial encounter: Secondary | ICD-10-CM | POA: Diagnosis not present

## 2019-02-25 DIAGNOSIS — Y838 Other surgical procedures as the cause of abnormal reaction of the patient, or of later complication, without mention of misadventure at the time of the procedure: Secondary | ICD-10-CM | POA: Diagnosis not present

## 2019-02-25 DIAGNOSIS — C61 Malignant neoplasm of prostate: Secondary | ICD-10-CM | POA: Diagnosis not present

## 2019-03-04 ENCOUNTER — Encounter: Payer: Self-pay | Admitting: Radiation Oncology

## 2019-03-04 NOTE — Progress Notes (Signed)
GU Location of Tumor / Histology: prostatic adenocarcinoma  If Prostate Cancer, Gleason Score is (4 + 4) and PSA is (4.22). Prostate volume: 55.3 cc.  Kevin Castro psa recently increased to 4.22 prompting a TRUS biopsy of the prostate on 11/07/2018.   Biopsies of prostate (if applicable) revealed:   Past/Anticipated interventions by urology, if any: prostate biopsy, bone scan (negative), CT scan abd/pelvis (negative), received LUPRON 1-11/2019 (3 month injection), prescribed bicalutamide x 30 days (course now complete)and flomax (began taking one week ago), referral for consideration of radiotherapy closer to home (on clinical trial at [facility]).   Patient  Past/Anticipated interventions by medical oncology, if any: no  Weight changes, if any: no  Bowel/Bladder complaints, if any: LUTS greatly improved with FLOMAX. IPSS 6. SHIM 1. Denies dysuria or hematuria. Reports scant rare urinary leakage. Denies any bowel complaints.   Nausea/Vomiting, if any: no  Pain issues, if any:  Reports chronic neck pain.   SAFETY ISSUES:  Prior radiation? Denies   Pacemaker/ICD? denies  Possible current pregnancy? no, male patient  Is the patient on methotrexate? no  Current Complaints / other details:  74 year old male. Retired Art gallery manager. Married with two adopted children.

## 2019-03-05 ENCOUNTER — Other Ambulatory Visit: Payer: Self-pay

## 2019-03-05 ENCOUNTER — Encounter: Payer: Self-pay | Admitting: Radiation Oncology

## 2019-03-05 ENCOUNTER — Ambulatory Visit
Admission: RE | Admit: 2019-03-05 | Discharge: 2019-03-05 | Disposition: A | Discharge status: Home / Self Care | Payer: Medicare Other | Source: Ambulatory Visit | Attending: Radiation Oncology | Admitting: Radiation Oncology

## 2019-03-05 VITALS — Ht 70.5 in | Wt 185.0 lb

## 2019-03-05 DIAGNOSIS — Z79899 Other long term (current) drug therapy: Secondary | ICD-10-CM | POA: Diagnosis not present

## 2019-03-05 DIAGNOSIS — C61 Malignant neoplasm of prostate: Secondary | ICD-10-CM

## 2019-03-05 DIAGNOSIS — R972 Elevated prostate specific antigen [PSA]: Secondary | ICD-10-CM | POA: Diagnosis not present

## 2019-03-05 DIAGNOSIS — N401 Enlarged prostate with lower urinary tract symptoms: Secondary | ICD-10-CM | POA: Diagnosis not present

## 2019-03-05 NOTE — Progress Notes (Signed)
Radiation Oncology         [phone] ________________________________  Outpatient Re-Consultation - Conducted via MyChart due to current COVID-19 concerns for limiting Kevin Castro exposure  Name: Kevin Castro MRN: [id number]  Date: 03/05/2019  DOB: 05/01/1945  EQ:ASTMH, Thayer Jew, MD  Collier Bullock, MD   REFERRING PHYSICIAN: Collier Bullock, MD  DIAGNOSIS: 74 y.o. 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    ICD-10-CM   1. Malignant neoplasm of prostate ([place])  C61     HISTORY OF PRESENT ILLNESS: Kevin Castro is a 74 y.o. male with a diagnosis of prostate cancer. He has a history of a prostate nodule, first noted in 07/1996. He was seen at [facility] by Dr. Risa Grill in 2008, who noted an approximately 1 cm nodule at the left lateral apex. Since his PSA remained normal, he continued in observation. More recently, however, his PSA increased from 2.3 in 05/2016 to 3.6 in 07/2017, and up to 4.22 in 07/2018. The Kevin Castro proceeded to transrectal ultrasound with 12 biopsies of the prostate on 11/07/2018.  The prostate volume measured 55.34 cc.  Out of 12 core biopsies, 8 were positive.  The maximum Gleason score was 4+4, and this was seen in the left and right base, both with perineural invasion. Additionally, Gleason 4+3 was seen in the left mid lateral (with PNI), left apex lateral (with PNI), left mid (with PNI), and left apex, Gleason 3+4 in the left base lateral (with PNI) and Gleason 3+3 in the right base lateral.  For disease staging, he had a CT A/P on 11/15/2018, which showed no signs of metastatic disease and a bone scan on 11/21/2018 which showed indeterminate uptake at the posterior right shoulder, likely degenerative and no other concerning sites of abnormal osseous uptake. Follow up shoulder x-ray performed on 11/27/2018 confirmed degenerative changed at the right shoulder area seen on bone scan.  We met the Kevin Castro on  11/27/2018 to discuss treatment options and at that time, he remained undecided regarding his treatment preference. He met with Dr. Ali Lowe at [facility] on 12/17/18 for second opinion and consideration of enrollment in a clinical trial which he ultimately has elected to proceed with.  In preparation for the clinical trial, he had a prostate MRI on 01/01/2019, which showed a PI-RADS 5 lesion in the left peripheral zone with evidence of extraglandular extension.   On 01/11/2019, he was officially enrolled in clinical trial [place] 1908: Image-Adapted Target Volumes Using 68Ga-HBED-CC PSMA-PET/MRI for Unfavorable-Risk Prostate Cancer. Within this trial, he will receive 18 months ADT concurrent with a brachytherapy boost followed by 5 weeks of prostate EBRT. A repeat PSA performed on 01/11/19 was 3.09. At that visit, he received his first dose of Eligard ADT (3 month injection) along with Casodex, which he took for one month.  As part of this trial, he had a prostate PSMA PET/MR and PET/CT on 01/18/19 which was negative for extraprostatic disease.  He last saw Dr. Ali Lowe on 02/25/2019 for follow up and was prescribed Flomax to begin prior to his brachytherapy to help manage anticipated procedure related LUTS.  He is scheduled for the brachytherapy procedure on 03/11/2019 at [facility]. He is also scheduled for his second dose of Eligard on 04/11/2019.  The Kevin Castro has been kindly referred back to [place] today to discuss the 5 weeks of EBRT which he prefers to receive here locally since this is much closer to his home.   Of note,  he also underwent bone density screening on 01/28/2019 which showed a T-score of -1.6, considered osteopenic.   PREVIOUS RADIATION THERAPY: No  PAST MEDICAL HISTORY:  Past Medical History:  Diagnosis Date  . Atrial tachycardia ([place])   . Paroxysmal atrial fibrillation ([facility])   . Prostate CA ([place])   . Sinus bradycardia   . Trifascicular block       PAST SURGICAL HISTORY: Past Surgical History:    Procedure Laterality Date  . DOBUTAMINE STRESS ECHO    . ELECTROPHYSIOLOGIC STUDY    . ELECTROPHYSIOLOGIC STUDY N/A 10/16/2015   Procedure: Atrial Fibrillation Ablation;  Surgeon: Thompson Grayer, MD;  Location: [facility];  Service: Cardiovascular;  Laterality: N/A;    FAMILY HISTORY:  Family History  Problem Relation Age of Onset  . Arrhythmia Mother   . Stroke Mother        68  . Lung cancer Father   . Liver disease Father   . Arrhythmia Sister   . Arrhythmia Brother   . Diabetes Brother        oldest brother  . Breast cancer Cousin   . Heart attack Neg Hx   . Hypertension Neg Hx   . Prostate cancer Neg Hx   . Pancreatic cancer Neg Hx   . Colon cancer Neg Hx     SOCIAL HISTORY:  Social History   Socioeconomic History  . Marital status: Married    Spouse name: Not on file  . Number of children: 2  . Years of education: Not on file  . Highest education level: Not on file  Occupational History  . Not on file  Tobacco Use  . Smoking status: Former Smoker    Packs/day: 0.75    Years: 7.00    Pack years: 5.25    Types: Cigarettes    Quit date: 01/31/1973    Years since quitting: 46.1  . Smokeless tobacco: Never Used  Substance and Sexual Activity  . Alcohol use: Yes    Alcohol/week: 12.0 standard drinks    Types: 12 Cans of beer per week    Comment: "can go through a couple six packs on the weekend"  . Drug use: No  . Sexual activity: Yes  Other Topics Concern  . Not on file  Social History Narrative   Two adopted children   Social Determinants of Health   Financial Resource Strain:   . Difficulty of Paying Living Expenses: Not on file  Food Insecurity:   . Worried About Charity fundraiser in the Last Year: Not on file  . Ran Out of Food in the Last Year: Not on file  Transportation Needs:   . Lack of Transportation (Medical): Not on file  . Lack of Transportation (Non-Medical): Not on file  Physical Activity:   . Days of Exercise per Week: Not  on file  . Minutes of Exercise per Session: Not on file  Stress:   . Feeling of Stress : Not on file  Social Connections:   . Frequency of Communication with Friends and Family: Not on file  . Frequency of Social Gatherings with Friends and Family: Not on file  . Attends Religious Services: Not on file  . Active Member of Clubs or Organizations: Not on file  . Attends Archivist Meetings: Not on file  . Marital Status: Not on file  Intimate Partner Violence:   . Fear of Current or Ex-Partner: Not on file  . Emotionally Abused: Not on file  .  Physically Abused: Not on file  . Sexually Abused: Not on file    ALLERGIES: Penicillins  MEDICATIONS:  Current Outpatient Medications  Medication Sig Dispense Refill  . Glucosamine-Chondroit-Vit C-Mn (GLUCOSAMINE 1500 COMPLEX PO) Take by mouth.    Marland Kitchen leuprolide (LUPRON) 22.5 MG injection Inject 22.5 mg into the muscle every 3 (three) months.    . Multiple Vitamin (MULTI-VITAMIN) tablet Take by mouth.    . Omega-3 Fatty Acids (FISH OIL) 1000 MG CAPS Take by mouth.    . tamsulosin (FLOMAX) 0.4 MG CAPS capsule Take by mouth.    Marland Kitchen acetaminophen (TYLENOL) 325 MG tablet Take 650 mg by mouth daily as needed (pain). Rarely takes     No current facility-administered medications for this encounter.    REVIEW OF SYSTEMS:  On review of systems, the Kevin Castro reports that he is doing well overall. He denies any chest pain, shortness of breath, cough, fevers, chills, night sweats, unintended weight changes. He denies any bowel disturbances, and denies abdominal pain, nausea or vomiting. He denies any new musculoskeletal or joint aches or pains. His IPSS score was 6, indicating mild urinary symptoms. He reports these are greatly improved with the Flomax. He does report rare scant urinary leakage. His SHIM score was 1, indicating he has severe erectile dysfunction. A complete review of systems is obtained and is otherwise negative.    PHYSICAL EXAM:    Wt Readings from Last 3 Encounters:  03/05/19 185 lb (83.9 kg)  08/08/18 184 lb (83.5 kg)  02/21/18 183 lb 6.4 oz (83.2 kg)   Temp Readings from Last 3 Encounters:  10/17/15 98 F (36.7 C) (Oral)  10/06/14 98.4 F (36.9 C) (Oral)   BP Readings from Last 3 Encounters:  08/08/18 108/62  02/21/18 122/82  10/18/16 124/74   Pulse Readings from Last 3 Encounters:  08/08/18 66  02/21/18 64  10/18/16 63   Pain Assessment Pain Score: 2  Pain Frequency: Constant Pain Loc: Neck(reports chronic neck pain)/10  In general this is a well appearing Caucasian gentleman in no acute distress. He's alert and oriented x4 and appropriate throughout the examination. Cardiopulmonary assessment is negative for acute distress and he exhibits normal effort.    KPS = 100  100 - Normal; no complaints; no evidence of disease. 90   - Able to carry on normal activity; minor signs or symptoms of disease. 80   - Normal activity with effort; some signs or symptoms of disease. 58   - Cares for self; unable to carry on normal activity or to do active work. 60   - Requires occasional assistance, but is able to care for most of his personal needs. 50   - Requires considerable assistance and frequent medical care. 83   - Disabled; requires special care and assistance. 7   - Severely disabled; hospital admission is indicated although death not imminent. 77   - Very sick; hospital admission necessary; active supportive treatment necessary. 10   - Moribund; fatal processes progressing rapidly. 0     - Dead  Karnofsky DA, Abelmann [place], Craver LS and Burchenal [facility] [phone]) The use of the nitrogen mustards in the palliative treatment of carcinoma: with particular reference to bronchogenic carcinoma Cancer 1 634-56  LABORATORY DATA:  Lab Results  Component Value Date   WBC 4.6 10/02/2015   HGB 14.1 10/02/2015   HCT 40.9 10/02/2015   MCV 94.7 10/02/2015   PLT 198 10/02/2015   Lab Results  Component Value Date  NA 140 10/02/2015   K 4.5 10/02/2015   CL 104 10/02/2015   CO2 28 10/02/2015   No results found for: ALT, AST, GGT, ALKPHOS, BILITOT   RADIOGRAPHY: No results found.    IMPRESSION/PLAN: This visit was conducted via MyChart to spare the Kevin Castro unnecessary potential exposure in the healthcare setting during the current COVID-19 pandemic. 1. 74 y.o. gentleman with Stage T2a adenocarcinoma of the prostate with Gleason Score of 4+4, and PSA of 4.22. We reviewed the Kevin Castro's workup and outlined the nature of prostate cancer in this setting. He is currently enrolled in clinical trial [place] 1908: Image-Adapted Target Volumes Using 68Ga-HBED-CC PSMA-PET/MRI for Unfavorable-Risk Prostate Cancer through [place]. Within this trial, he will receive 18 months ADT (started 01/11/19) concurrent with a brachytherapy boost (scheduled 03/11/19) followed by 5 weeks of prostate EBRT. He preferred to receive the 5 weeks of external beam radiation closer to home here in [place]. We reviewed the details and logistics of delivery as well as the risks, benefits, short and long-term effects associated with radiotherapy. We also reviewed the role of ADT in the treatment of high risk prostate cancer and outlined the associated side effects that could be expected with this therapy. He appears to have a good understanding of his disease and the recommendations for treatment which are of curative intent.  He and his wife were encouraged to ask questions which were answered to his stated satisfaction.  At the end of the conversation the Kevin Castro elects to proceed with 5 weeks of daily external beam radiation therapy here in [place] approximately 6 weeks post-brachytherapy. He is currently scheduled for the brachytherapy boost procedure on Monday, 03/11/2019. We will share our discussion with Dr. Ali Lowe at [facility], and tentatively schedule the Kevin Castro for CT SIM on 04/12/19 in anticipation of beginning his daily radiation treatments on  04/22/19, 6 weeks following his brachytherapy boost.  We look forward to continuing to participate in his care.  Given current concerns for Kevin Castro exposure during the COVID-19 pandemic, this encounter was conducted via video-enabled MyChart. The Kevin Castro has given verbal consent for this type of encounter. The time spent during this encounter was 45 minutes. The attendants for this meeting include Tyler Pita MD, Ashlyn Bruning PA-C, [place], and Kevin Castro, Kevin Castro. During the encounter, Tyler Pita MD, Ashlyn Bruning PA-C, and scribe, Wilburn Mylar were located at [place].  Kevin Castro, Kevin Castro was located at home.     Nicholos Johns, PA-C    Tyler Pita, MD  [facility] Direct Dial: [phone]  Fax: [phone] [email or web address]  Skype  [email or web address]   This document serves as a record of services personally performed by Tyler Pita, MD and Freeman Caldron, PA-C. It was created on their behalf by Wilburn Mylar, a trained medical scribe. The creation of this record is based on the scribe's personal observations and the provider's statements to them. This document has been checked and approved by the attending provider.

## 2019-03-09 DIAGNOSIS — Z01812 Encounter for preprocedural laboratory examination: Secondary | ICD-10-CM | POA: Diagnosis not present

## 2019-03-09 DIAGNOSIS — Z20822 Contact with and (suspected) exposure to covid-19: Secondary | ICD-10-CM | POA: Diagnosis not present

## 2019-03-11 ENCOUNTER — Encounter: Payer: Self-pay | Admitting: Medical Oncology

## 2019-03-11 DIAGNOSIS — C61 Malignant neoplasm of prostate: Secondary | ICD-10-CM | POA: Diagnosis not present

## 2019-03-11 DIAGNOSIS — Y838 Other surgical procedures as the cause of abnormal reaction of the patient, or of later complication, without mention of misadventure at the time of the procedure: Secondary | ICD-10-CM | POA: Diagnosis not present

## 2019-03-11 DIAGNOSIS — T839XXA Unspecified complication of genitourinary prosthetic device, implant and graft, initial encounter: Secondary | ICD-10-CM | POA: Diagnosis not present

## 2019-03-13 ENCOUNTER — Ambulatory Visit: Payer: Medicare HMO | Attending: Internal Medicine

## 2019-03-13 DIAGNOSIS — Z23 Encounter for immunization: Secondary | ICD-10-CM | POA: Insufficient documentation

## 2019-03-13 NOTE — Progress Notes (Signed)
   Covid-19 Vaccination Clinic  Name:  Kevin Castro    MRN: [id number] DOB: [birth year]  03/13/2019  Kevin Castro was observed post Covid-19 immunization for 15 minutes without incidence. He was provided with Vaccine Information Sheet and instruction to access the V-Safe system.   Kevin Castro was instructed to call 911 with any severe reactions post vaccine: Marland Kitchen Difficulty breathing  . Swelling of your face and throat  . A fast heartbeat  . A bad rash all over your body  . Dizziness and weakness    Immunizations Administered    Name Date Dose VIS Date Route   Pfizer COVID-19 Vaccine 03/13/2019  1:18 PM 0.3 mL 01/11/2019 Intramuscular   Manufacturer: [place]   Lot: [id number]   [place]: [id number]

## 2019-03-17 ENCOUNTER — Ambulatory Visit: Payer: Medicare HMO

## 2019-03-18 ENCOUNTER — Ambulatory Visit: Payer: Medicare HMO

## 2019-03-25 DIAGNOSIS — M542 Cervicalgia: Secondary | ICD-10-CM | POA: Diagnosis not present

## 2019-03-25 DIAGNOSIS — M4722 Other spondylosis with radiculopathy, cervical region: Secondary | ICD-10-CM | POA: Diagnosis not present

## 2019-04-08 DIAGNOSIS — Y838 Other surgical procedures as the cause of abnormal reaction of the patient, or of later complication, without mention of misadventure at the time of the procedure: Secondary | ICD-10-CM | POA: Diagnosis not present

## 2019-04-08 DIAGNOSIS — C61 Malignant neoplasm of prostate: Secondary | ICD-10-CM | POA: Diagnosis not present

## 2019-04-08 DIAGNOSIS — T839XXA Unspecified complication of genitourinary prosthetic device, implant and graft, initial encounter: Secondary | ICD-10-CM | POA: Diagnosis not present

## 2019-04-08 NOTE — Progress Notes (Signed)
  Radiation Oncology         [phone] ________________________________  Name: Bric Rardin MRN: [id number]  Date: 04/12/2019  DOB: November 02, 1945  SIMULATION AND TREATMENT PLANNING NOTE    ICD-10-CM   1. Malignant neoplasm of prostate ([place])  C61     DIAGNOSIS:  74 y.o. 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  Identity was confirmed.  All relevant records and images related to the planned course of therapy were reviewed.  The patient freely provided informed written consent to proceed with treatment after reviewing the details related to the planned course of therapy. The consent form was witnessed and verified by the simulation staff.  Then, the patient was set-up in a stable reproducible supine position for radiation therapy.  A vacuum lock pillow device was custom fabricated to position his legs in a reproducible immobilized position.  Then, I performed a urethrogram under sterile conditions to identify the prostatic apex.  CT images were obtained.  Surface markings were placed.  The CT images were loaded into the planning software.  Then the prostate target and avoidance structures including the rectum, bladder, bowel and hips were contoured.  Treatment planning then occurred.  The radiation prescription was entered and confirmed.  A total of one complex treatment devices were fabricated. I have requested : Intensity Modulated Radiotherapy (IMRT) is medically necessary for this case for the following reason:  Rectal sparing.Marland Kitchen  PLAN:  The patient will receive 45 Gy in 25 fractions of 1.8 Gy, to supplement an up-front prostate seed implant boost at [facility] on 03/11/19 to a dose of 110 Gy to achieve a total nominal dose of 165 Gy.  ________________________________  Sheral Apley. Tammi Klippel, M.D.

## 2019-04-12 ENCOUNTER — Other Ambulatory Visit: Payer: Self-pay

## 2019-04-12 ENCOUNTER — Ambulatory Visit: Payer: Medicare HMO | Admitting: Internal Medicine

## 2019-04-12 ENCOUNTER — Encounter: Payer: Self-pay | Admitting: Urology

## 2019-04-12 ENCOUNTER — Encounter: Payer: Self-pay | Admitting: Internal Medicine

## 2019-04-12 ENCOUNTER — Ambulatory Visit
Admission: RE | Admit: 2019-04-12 | Discharge: 2019-04-12 | Disposition: A | Discharge status: Home / Self Care | Payer: Medicare HMO | Source: Ambulatory Visit | Attending: Radiation Oncology | Admitting: Radiation Oncology

## 2019-04-12 VITALS — BP 108/58 | HR 69 | Ht 70.5 in | Wt 190.0 lb

## 2019-04-12 DIAGNOSIS — Z510 Encounter for antineoplastic radiation therapy: Secondary | ICD-10-CM | POA: Insufficient documentation

## 2019-04-12 DIAGNOSIS — Z95810 Presence of automatic (implantable) cardiac defibrillator: Secondary | ICD-10-CM

## 2019-04-12 DIAGNOSIS — I480 Paroxysmal atrial fibrillation: Secondary | ICD-10-CM

## 2019-04-12 DIAGNOSIS — C61 Malignant neoplasm of prostate: Secondary | ICD-10-CM | POA: Insufficient documentation

## 2019-04-12 DIAGNOSIS — R002 Palpitations: Secondary | ICD-10-CM

## 2019-04-12 DIAGNOSIS — I452 Bifascicular block: Secondary | ICD-10-CM

## 2019-04-12 NOTE — Patient Instructions (Signed)

## 2019-04-12 NOTE — Progress Notes (Signed)
Patient Care Team: Deland Pretty, MD as PCP - General (Internal Medicine) Cira Rue, RN Nurse Navigator as Registered Nurse (Medical Oncology)   HPI  Kevin Castro is a 74 y.o. male Seen in follow-up for atrial fib.  I saw him 2016 and he sought primary therapy with ablation which was done by [facility] 2017 cx by post procedural SOB  Had syncope assoc with AFib 2017  No recurrent syncope.  Some irregularity.  AliveCor tracings have suggested A. fib.  They are not here for review  Has been diagnosed with prostate cancer.  He is currently undergoing radiation therapy and hormonal therapy.  He has loss of energy.    Thromboembolic risk factors ( age -89) for a CHADSVASc Score of 1       Past Medical History:  Diagnosis Date  . Atrial tachycardia ([place])   . Paroxysmal atrial fibrillation ([facility])   . Prostate CA ([place])   . Sinus bradycardia   . Trifascicular block     Past Surgical History:  Procedure Laterality Date  . DOBUTAMINE STRESS ECHO    . ELECTROPHYSIOLOGIC STUDY    . ELECTROPHYSIOLOGIC STUDY N/A 10/16/2015   Procedure: Atrial Fibrillation Ablation;  Surgeon: Thompson Grayer, MD;  Location: [place];  Service: Cardiovascular;  Laterality: N/A;    Current Outpatient Medications  Medication Sig Dispense Refill  . acetaminophen (TYLENOL) 325 MG tablet Take 650 mg by mouth daily as needed (pain). Rarely takes    . Glucosamine-Chondroit-Vit C-Mn (GLUCOSAMINE 1500 COMPLEX PO) Take by mouth.    Marland Kitchen leuprolide (LUPRON) 22.5 MG injection Inject 22.5 mg into the muscle every 3 (three) months.    . Multiple Vitamin (MULTI-VITAMIN) tablet Take by mouth.    . Omega-3 Fatty Acids (FISH OIL) 1000 MG CAPS Take by mouth.    . tamsulosin (FLOMAX) 0.4 MG CAPS capsule Take by mouth.     No current facility-administered medications for this visit.    Allergies  Allergen Reactions  . Penicillins Rash    Rash/blisters Has patient had a PCN reaction causing immediate rash,  facial/tongue/throat swelling, SOB or lightheadedness with hypotension: Yes Has patient had a PCN reaction causing severe rash involving mucus membranes or skin necrosis: No Has patient had a PCN reaction that required hospitalization No Has patient had a PCN reaction occurring within the last 10 years: No If all of the above answers are "NO", then may proceed with Cephalosporin use.       Review of Systems negative except from HPI and PMH  Physical Exam BP (!) 108/58   Pulse 69   Ht 5' 10.5" (1.791 m)   Wt 190 lb (86.2 kg)   SpO2 98%   BMI 26.88 kg/m  Well developed and nourished in no acute distress HENT normal Neck supple with JVP-  Flat  Clear Regular rate and rhythm, no murmurs or gallops Abd-soft with active BS No Clubbing cyanosis edema Skin-warm and dry A & Oriented  Grossly normal sensory and motor function  Sinus rhythm at 69 Intervals 20/14/44 Right bundle branch block left anterior fascicular block Runs of PACs at a rate of about 85 bpm  Assessment and  Plan  Bifascicular block  Chest pain-pleuritic  Palpitations-- frequent atrial ectopy   Atrial fibrillation no interval that we are aware of  Syncope-neurally mediated    We will continue to survey for atrial fibrillation.  His CHA2DS2-VASc score is 1.  No clear indication for anticoagulation at this time.  We will use his AliveCor monitoring to try to help with this.   We spent more than 50% of our >30 min visit in face to face counseling regarding the above      Current medicines are reviewed at length with the patient today .  The patient does not  have concerns regarding medicines.

## 2019-04-12 NOTE — Progress Notes (Signed)
Patient plans to continue his Urologic care here locally, with Dr. Diona Fanti going forward.  Nicholos Johns, MMS, PA-C [facility] at [place]: [phone]  Fax: [phone]

## 2019-04-16 DIAGNOSIS — C61 Malignant neoplasm of prostate: Secondary | ICD-10-CM | POA: Diagnosis not present

## 2019-04-17 DIAGNOSIS — Z510 Encounter for antineoplastic radiation therapy: Secondary | ICD-10-CM | POA: Diagnosis not present

## 2019-04-17 DIAGNOSIS — M5030 Other cervical disc degeneration, unspecified cervical region: Secondary | ICD-10-CM | POA: Diagnosis not present

## 2019-04-17 DIAGNOSIS — C61 Malignant neoplasm of prostate: Secondary | ICD-10-CM | POA: Diagnosis not present

## 2019-04-22 DIAGNOSIS — R69 Illness, unspecified: Secondary | ICD-10-CM | POA: Diagnosis not present

## 2019-04-23 ENCOUNTER — Other Ambulatory Visit: Payer: Self-pay

## 2019-04-23 ENCOUNTER — Encounter: Payer: Self-pay | Admitting: Medical Oncology

## 2019-04-23 ENCOUNTER — Ambulatory Visit
Admission: RE | Admit: 2019-04-23 | Discharge: 2019-04-23 | Disposition: A | Discharge status: Home / Self Care | Payer: Medicare HMO | Source: Ambulatory Visit | Attending: Radiation Oncology | Admitting: Radiation Oncology

## 2019-04-23 DIAGNOSIS — C61 Malignant neoplasm of prostate: Secondary | ICD-10-CM | POA: Diagnosis not present

## 2019-04-23 DIAGNOSIS — Z510 Encounter for antineoplastic radiation therapy: Secondary | ICD-10-CM | POA: Diagnosis not present

## 2019-04-24 ENCOUNTER — Other Ambulatory Visit: Payer: Self-pay

## 2019-04-24 ENCOUNTER — Ambulatory Visit
Admission: RE | Admit: 2019-04-24 | Discharge: 2019-04-24 | Disposition: A | Discharge status: Home / Self Care | Payer: Medicare HMO | Source: Ambulatory Visit | Attending: Radiation Oncology | Admitting: Radiation Oncology

## 2019-04-24 DIAGNOSIS — C61 Malignant neoplasm of prostate: Secondary | ICD-10-CM | POA: Diagnosis not present

## 2019-04-24 DIAGNOSIS — Z510 Encounter for antineoplastic radiation therapy: Secondary | ICD-10-CM | POA: Diagnosis not present

## 2019-04-25 ENCOUNTER — Ambulatory Visit
Admission: RE | Admit: 2019-04-25 | Discharge: 2019-04-25 | Disposition: A | Discharge status: Home / Self Care | Payer: Medicare HMO | Source: Ambulatory Visit | Attending: Radiation Oncology | Admitting: Radiation Oncology

## 2019-04-25 ENCOUNTER — Other Ambulatory Visit: Payer: Self-pay

## 2019-04-25 DIAGNOSIS — Z510 Encounter for antineoplastic radiation therapy: Secondary | ICD-10-CM | POA: Diagnosis not present

## 2019-04-25 DIAGNOSIS — C61 Malignant neoplasm of prostate: Secondary | ICD-10-CM | POA: Diagnosis not present

## 2019-04-26 ENCOUNTER — Other Ambulatory Visit: Payer: Self-pay

## 2019-04-26 ENCOUNTER — Ambulatory Visit
Admission: RE | Admit: 2019-04-26 | Discharge: 2019-04-26 | Disposition: A | Discharge status: Home / Self Care | Payer: Medicare HMO | Source: Ambulatory Visit | Attending: Radiation Oncology | Admitting: Radiation Oncology

## 2019-04-26 DIAGNOSIS — C61 Malignant neoplasm of prostate: Secondary | ICD-10-CM | POA: Diagnosis not present

## 2019-04-26 DIAGNOSIS — Z510 Encounter for antineoplastic radiation therapy: Secondary | ICD-10-CM | POA: Diagnosis not present

## 2019-04-29 ENCOUNTER — Other Ambulatory Visit: Payer: Self-pay

## 2019-04-29 ENCOUNTER — Ambulatory Visit
Admission: RE | Admit: 2019-04-29 | Discharge: 2019-04-29 | Disposition: A | Discharge status: Home / Self Care | Payer: Medicare HMO | Source: Ambulatory Visit | Attending: Radiation Oncology | Admitting: Radiation Oncology

## 2019-04-29 DIAGNOSIS — C61 Malignant neoplasm of prostate: Secondary | ICD-10-CM | POA: Diagnosis not present

## 2019-04-29 DIAGNOSIS — Z510 Encounter for antineoplastic radiation therapy: Secondary | ICD-10-CM | POA: Diagnosis not present

## 2019-04-30 ENCOUNTER — Other Ambulatory Visit: Payer: Self-pay

## 2019-04-30 ENCOUNTER — Ambulatory Visit
Admission: RE | Admit: 2019-04-30 | Discharge: 2019-04-30 | Disposition: A | Discharge status: Home / Self Care | Payer: Medicare HMO | Source: Ambulatory Visit | Attending: Radiation Oncology | Admitting: Radiation Oncology

## 2019-04-30 DIAGNOSIS — C61 Malignant neoplasm of prostate: Secondary | ICD-10-CM | POA: Diagnosis not present

## 2019-04-30 DIAGNOSIS — Z510 Encounter for antineoplastic radiation therapy: Secondary | ICD-10-CM | POA: Diagnosis not present

## 2019-05-01 ENCOUNTER — Ambulatory Visit
Admission: RE | Admit: 2019-05-01 | Discharge: 2019-05-01 | Disposition: A | Discharge status: Home / Self Care | Payer: Medicare HMO | Source: Ambulatory Visit | Attending: Radiation Oncology | Admitting: Radiation Oncology

## 2019-05-01 ENCOUNTER — Other Ambulatory Visit: Payer: Self-pay

## 2019-05-01 DIAGNOSIS — C61 Malignant neoplasm of prostate: Secondary | ICD-10-CM | POA: Diagnosis not present

## 2019-05-01 DIAGNOSIS — Z510 Encounter for antineoplastic radiation therapy: Secondary | ICD-10-CM | POA: Diagnosis not present

## 2019-05-02 ENCOUNTER — Ambulatory Visit
Admission: RE | Admit: 2019-05-02 | Discharge: 2019-05-02 | Disposition: A | Discharge status: Home / Self Care | Payer: Medicare HMO | Source: Ambulatory Visit | Attending: Radiation Oncology | Admitting: Radiation Oncology

## 2019-05-02 ENCOUNTER — Other Ambulatory Visit: Payer: Self-pay

## 2019-05-02 DIAGNOSIS — C61 Malignant neoplasm of prostate: Secondary | ICD-10-CM | POA: Diagnosis not present

## 2019-05-02 DIAGNOSIS — Z510 Encounter for antineoplastic radiation therapy: Secondary | ICD-10-CM | POA: Insufficient documentation

## 2019-05-03 ENCOUNTER — Encounter: Payer: Self-pay | Admitting: Medical Oncology

## 2019-05-03 ENCOUNTER — Ambulatory Visit
Admission: RE | Admit: 2019-05-03 | Discharge: 2019-05-03 | Disposition: A | Discharge status: Home / Self Care | Payer: Medicare HMO | Source: Ambulatory Visit | Attending: Radiation Oncology | Admitting: Radiation Oncology

## 2019-05-03 ENCOUNTER — Other Ambulatory Visit: Payer: Self-pay

## 2019-05-03 DIAGNOSIS — Z510 Encounter for antineoplastic radiation therapy: Secondary | ICD-10-CM | POA: Diagnosis not present

## 2019-05-03 DIAGNOSIS — C61 Malignant neoplasm of prostate: Secondary | ICD-10-CM | POA: Diagnosis not present

## 2019-05-06 ENCOUNTER — Ambulatory Visit
Admission: RE | Admit: 2019-05-06 | Discharge: 2019-05-06 | Disposition: A | Discharge status: Home / Self Care | Payer: Medicare HMO | Source: Ambulatory Visit | Attending: Radiation Oncology | Admitting: Radiation Oncology

## 2019-05-06 ENCOUNTER — Other Ambulatory Visit: Payer: Self-pay

## 2019-05-06 DIAGNOSIS — Z510 Encounter for antineoplastic radiation therapy: Secondary | ICD-10-CM | POA: Diagnosis not present

## 2019-05-06 DIAGNOSIS — C61 Malignant neoplasm of prostate: Secondary | ICD-10-CM | POA: Diagnosis not present

## 2019-05-07 ENCOUNTER — Other Ambulatory Visit: Payer: Self-pay

## 2019-05-07 ENCOUNTER — Ambulatory Visit
Admission: RE | Admit: 2019-05-07 | Discharge: 2019-05-07 | Disposition: A | Discharge status: Home / Self Care | Payer: Medicare HMO | Source: Ambulatory Visit | Attending: Radiation Oncology | Admitting: Radiation Oncology

## 2019-05-07 DIAGNOSIS — C61 Malignant neoplasm of prostate: Secondary | ICD-10-CM | POA: Diagnosis not present

## 2019-05-07 DIAGNOSIS — Z510 Encounter for antineoplastic radiation therapy: Secondary | ICD-10-CM | POA: Diagnosis not present

## 2019-05-08 ENCOUNTER — Ambulatory Visit
Admission: RE | Admit: 2019-05-08 | Discharge: 2019-05-08 | Disposition: A | Discharge status: Home / Self Care | Payer: Medicare HMO | Source: Ambulatory Visit | Attending: Radiation Oncology | Admitting: Radiation Oncology

## 2019-05-08 ENCOUNTER — Other Ambulatory Visit: Payer: Self-pay

## 2019-05-08 DIAGNOSIS — C61 Malignant neoplasm of prostate: Secondary | ICD-10-CM | POA: Diagnosis not present

## 2019-05-08 DIAGNOSIS — Z510 Encounter for antineoplastic radiation therapy: Secondary | ICD-10-CM | POA: Diagnosis not present

## 2019-05-09 ENCOUNTER — Other Ambulatory Visit: Payer: Self-pay

## 2019-05-09 ENCOUNTER — Ambulatory Visit
Admission: RE | Admit: 2019-05-09 | Discharge: 2019-05-09 | Disposition: A | Discharge status: Home / Self Care | Payer: Medicare HMO | Source: Ambulatory Visit | Attending: Radiation Oncology | Admitting: Radiation Oncology

## 2019-05-09 DIAGNOSIS — C61 Malignant neoplasm of prostate: Secondary | ICD-10-CM | POA: Diagnosis not present

## 2019-05-09 DIAGNOSIS — Z510 Encounter for antineoplastic radiation therapy: Secondary | ICD-10-CM | POA: Diagnosis not present

## 2019-05-10 ENCOUNTER — Ambulatory Visit
Admission: RE | Admit: 2019-05-10 | Discharge: 2019-05-10 | Disposition: A | Discharge status: Home / Self Care | Payer: Medicare HMO | Source: Ambulatory Visit | Attending: Radiation Oncology | Admitting: Radiation Oncology

## 2019-05-10 ENCOUNTER — Other Ambulatory Visit: Payer: Self-pay

## 2019-05-10 DIAGNOSIS — Z510 Encounter for antineoplastic radiation therapy: Secondary | ICD-10-CM | POA: Diagnosis not present

## 2019-05-10 DIAGNOSIS — C61 Malignant neoplasm of prostate: Secondary | ICD-10-CM | POA: Diagnosis not present

## 2019-05-13 ENCOUNTER — Ambulatory Visit
Admission: RE | Admit: 2019-05-13 | Discharge: 2019-05-13 | Disposition: A | Discharge status: Home / Self Care | Payer: Medicare HMO | Source: Ambulatory Visit | Attending: Radiation Oncology | Admitting: Radiation Oncology

## 2019-05-13 ENCOUNTER — Other Ambulatory Visit: Payer: Self-pay

## 2019-05-13 DIAGNOSIS — Z510 Encounter for antineoplastic radiation therapy: Secondary | ICD-10-CM | POA: Diagnosis not present

## 2019-05-13 DIAGNOSIS — C61 Malignant neoplasm of prostate: Secondary | ICD-10-CM | POA: Diagnosis not present

## 2019-05-14 ENCOUNTER — Other Ambulatory Visit: Payer: Self-pay

## 2019-05-14 ENCOUNTER — Ambulatory Visit
Admission: RE | Admit: 2019-05-14 | Discharge: 2019-05-14 | Disposition: A | Discharge status: Home / Self Care | Payer: Medicare HMO | Source: Ambulatory Visit | Attending: Radiation Oncology | Admitting: Radiation Oncology

## 2019-05-14 DIAGNOSIS — Z510 Encounter for antineoplastic radiation therapy: Secondary | ICD-10-CM | POA: Diagnosis not present

## 2019-05-14 DIAGNOSIS — C61 Malignant neoplasm of prostate: Secondary | ICD-10-CM | POA: Diagnosis not present

## 2019-05-15 ENCOUNTER — Other Ambulatory Visit: Payer: Self-pay

## 2019-05-15 ENCOUNTER — Ambulatory Visit
Admission: RE | Admit: 2019-05-15 | Discharge: 2019-05-15 | Disposition: A | Discharge status: Home / Self Care | Payer: Medicare HMO | Source: Ambulatory Visit | Attending: Radiation Oncology | Admitting: Radiation Oncology

## 2019-05-15 DIAGNOSIS — Z510 Encounter for antineoplastic radiation therapy: Secondary | ICD-10-CM | POA: Diagnosis not present

## 2019-05-15 DIAGNOSIS — C61 Malignant neoplasm of prostate: Secondary | ICD-10-CM | POA: Diagnosis not present

## 2019-05-16 ENCOUNTER — Other Ambulatory Visit: Payer: Self-pay

## 2019-05-16 ENCOUNTER — Ambulatory Visit
Admission: RE | Admit: 2019-05-16 | Discharge: 2019-05-16 | Disposition: A | Discharge status: Home / Self Care | Payer: Medicare HMO | Source: Ambulatory Visit | Attending: Radiation Oncology | Admitting: Radiation Oncology

## 2019-05-16 DIAGNOSIS — Z510 Encounter for antineoplastic radiation therapy: Secondary | ICD-10-CM | POA: Diagnosis not present

## 2019-05-16 DIAGNOSIS — C61 Malignant neoplasm of prostate: Secondary | ICD-10-CM | POA: Diagnosis not present

## 2019-05-17 ENCOUNTER — Ambulatory Visit
Admission: RE | Admit: 2019-05-17 | Discharge: 2019-05-17 | Disposition: A | Discharge status: Home / Self Care | Payer: Medicare HMO | Source: Ambulatory Visit | Attending: Radiation Oncology | Admitting: Radiation Oncology

## 2019-05-17 ENCOUNTER — Other Ambulatory Visit: Payer: Self-pay

## 2019-05-17 DIAGNOSIS — C61 Malignant neoplasm of prostate: Secondary | ICD-10-CM | POA: Diagnosis not present

## 2019-05-17 DIAGNOSIS — Z510 Encounter for antineoplastic radiation therapy: Secondary | ICD-10-CM | POA: Diagnosis not present

## 2019-05-20 ENCOUNTER — Ambulatory Visit
Admission: RE | Admit: 2019-05-20 | Discharge: 2019-05-20 | Disposition: A | Discharge status: Home / Self Care | Payer: Medicare HMO | Source: Ambulatory Visit | Attending: Radiation Oncology | Admitting: Radiation Oncology

## 2019-05-20 ENCOUNTER — Other Ambulatory Visit: Payer: Self-pay

## 2019-05-20 DIAGNOSIS — C61 Malignant neoplasm of prostate: Secondary | ICD-10-CM | POA: Diagnosis not present

## 2019-05-20 DIAGNOSIS — Z510 Encounter for antineoplastic radiation therapy: Secondary | ICD-10-CM | POA: Diagnosis not present

## 2019-05-21 ENCOUNTER — Ambulatory Visit
Admission: RE | Admit: 2019-05-21 | Discharge: 2019-05-21 | Disposition: A | Discharge status: Home / Self Care | Payer: Medicare HMO | Source: Ambulatory Visit | Attending: Radiation Oncology | Admitting: Radiation Oncology

## 2019-05-21 DIAGNOSIS — C61 Malignant neoplasm of prostate: Secondary | ICD-10-CM | POA: Diagnosis not present

## 2019-05-21 DIAGNOSIS — Z510 Encounter for antineoplastic radiation therapy: Secondary | ICD-10-CM | POA: Diagnosis not present

## 2019-05-22 ENCOUNTER — Ambulatory Visit
Admission: RE | Admit: 2019-05-22 | Discharge: 2019-05-22 | Disposition: A | Discharge status: Home / Self Care | Payer: Medicare HMO | Source: Ambulatory Visit | Attending: Radiation Oncology | Admitting: Radiation Oncology

## 2019-05-22 ENCOUNTER — Other Ambulatory Visit: Payer: Self-pay

## 2019-05-22 DIAGNOSIS — C61 Malignant neoplasm of prostate: Secondary | ICD-10-CM | POA: Diagnosis not present

## 2019-05-22 DIAGNOSIS — Z510 Encounter for antineoplastic radiation therapy: Secondary | ICD-10-CM | POA: Diagnosis not present

## 2019-05-23 ENCOUNTER — Ambulatory Visit
Admission: RE | Admit: 2019-05-23 | Discharge: 2019-05-23 | Disposition: A | Discharge status: Home / Self Care | Payer: Medicare HMO | Source: Ambulatory Visit | Attending: Radiation Oncology | Admitting: Radiation Oncology

## 2019-05-23 ENCOUNTER — Other Ambulatory Visit: Payer: Self-pay

## 2019-05-23 DIAGNOSIS — C61 Malignant neoplasm of prostate: Secondary | ICD-10-CM | POA: Diagnosis not present

## 2019-05-23 DIAGNOSIS — Z510 Encounter for antineoplastic radiation therapy: Secondary | ICD-10-CM | POA: Diagnosis not present

## 2019-05-24 ENCOUNTER — Ambulatory Visit
Admission: RE | Admit: 2019-05-24 | Discharge: 2019-05-24 | Disposition: A | Discharge status: Home / Self Care | Payer: Medicare HMO | Source: Ambulatory Visit | Attending: Radiation Oncology | Admitting: Radiation Oncology

## 2019-05-24 ENCOUNTER — Other Ambulatory Visit: Payer: Self-pay

## 2019-05-24 DIAGNOSIS — Z510 Encounter for antineoplastic radiation therapy: Secondary | ICD-10-CM | POA: Diagnosis not present

## 2019-05-24 DIAGNOSIS — C61 Malignant neoplasm of prostate: Secondary | ICD-10-CM | POA: Diagnosis not present

## 2019-05-27 ENCOUNTER — Ambulatory Visit
Admission: RE | Admit: 2019-05-27 | Discharge: 2019-05-27 | Disposition: A | Discharge status: Home / Self Care | Payer: Medicare HMO | Source: Ambulatory Visit | Attending: Radiation Oncology | Admitting: Radiation Oncology

## 2019-05-27 ENCOUNTER — Encounter: Payer: Self-pay | Admitting: Medical Oncology

## 2019-05-27 ENCOUNTER — Other Ambulatory Visit: Payer: Self-pay

## 2019-05-27 ENCOUNTER — Encounter: Payer: Self-pay | Admitting: Radiation Oncology

## 2019-05-27 DIAGNOSIS — Z510 Encounter for antineoplastic radiation therapy: Secondary | ICD-10-CM | POA: Diagnosis not present

## 2019-05-27 DIAGNOSIS — C61 Malignant neoplasm of prostate: Secondary | ICD-10-CM | POA: Diagnosis not present

## 2019-05-28 DIAGNOSIS — M4722 Other spondylosis with radiculopathy, cervical region: Secondary | ICD-10-CM | POA: Diagnosis not present

## 2019-06-18 NOTE — Progress Notes (Signed)
  Radiation Oncology         [phone] ________________________________  Name: Kevin Castro MRN: [id number]  Date: 05/27/2019  DOB: 28-Dec-1945  End of Treatment Note  Diagnosis:   74 y.o. 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     Indication for treatment:  Curative, Definitive Radiotherapy       Radiation treatment dates:    03/11/19- upfront brachytherapy boost   04/23/19 - 05/27/19- 25 fractions of EBRT to the prostate, seminal vesicles and pelvic lymph nodes  Site/dose:  1. Radioactive seeds were implanted into the prostate for a total of 110 Gy. 2. The prostate, seminal vesicles, and pelvic lymph nodes were boosted with 45 Gy in 25 fractions of 1.8 Gy, for a total dose of 155 Gy  Beams/energy:  1. The radioactive seeds were delivered under sagittal guidance with 3D imaging. 2. The prostate, seminal vesicles, and pelvic lymph nodes were initially treated using helical intensity modulated radiotherapy delivering 6 megavolt photons. Image guidance was performed with megavoltage CT studies prior to each fraction. He was immobilized with a body fix lower extremity mold.   Narrative: The patient tolerated radiation treatment relatively well. By the end of treatment, he reported intense, constant dysuria, nocturia every hour, intermittent and weak stream, urinary urgency that is worse with caffeine, occasional diarrhea (manageable), and fatigue. He denied hematuria, incomplete bladder emptying, urinary leakage or incontinence, and having to strain.  He received concurrent ADT, and continued on Flomax throughout treatment.  Plan: The patient has completed radiation treatment. He will return to radiation oncology clinic for routine followup in one month. I advised him to call or return sooner if he has any questions or concerns related to his recovery or treatment. ________________________________  Sheral Apley. Tammi Klippel,  M.D.  This document serves as a record of services personally performed by Tyler Pita, MD. It was created on his behalf by Wilburn Mylar, a trained medical scribe. The creation of this record is based on the scribe's personal observations and the provider's statements to them. This document has been checked and approved by the attending provider.

## 2019-06-25 NOTE — Progress Notes (Signed)
Follow up patient for Prostate treatment. Patient denies any hematuria at this time. Reports dysuria with urination urinary stream is weak with flow varying and stopping and starting while urinating also reporting flow may flow normally or may often dribble to empty bladder. Often having to push and strain to empty bladder. Complains of nocturia some nights 5-6 times a night and other nights its less. Is currently on Flomax. Does not have a follow up urology appointment scheduled at this time. Is currently follow up [facility].

## 2019-06-26 ENCOUNTER — Ambulatory Visit
Admission: RE | Admit: 2019-06-26 | Discharge: 2019-06-26 | Disposition: A | Discharge status: Home / Self Care | Payer: Medicare HMO | Source: Ambulatory Visit | Attending: Urology | Admitting: Urology

## 2019-06-26 ENCOUNTER — Other Ambulatory Visit: Payer: Self-pay

## 2019-06-26 DIAGNOSIS — C61 Malignant neoplasm of prostate: Secondary | ICD-10-CM

## 2019-06-26 NOTE — Progress Notes (Signed)
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     Interval Since Last Radiation:  4.5 weeks  03/11/19- upfront brachytherapy boost at [facility] Radioactive seeds were implanted into the prostate for a total of 110 Gy.  04/23/19 - 05/27/19- The prostate, seminal vesicles, and pelvic lymph nodes were boosted with 45 Gy in 25 fractions of 1.8 Gy, for a total dose of 155 Gy  Narrative:  I spoke with the patient to conduct his routine scheduled 1 month follow up visit via telephone to spare the patient unnecessary potential exposure in the healthcare setting during the current COVID-19 pandemic.  The patient was notified in advance and gave permission to proceed with this visit format. He tolerated radiation treatment relatively well. By the end of treatment, he reported intense, constant dysuria, nocturia every hour, intermittency and weak stream, urinary urgency that is worse with caffeine, occasional diarrhea (manageable), and fatigue. He denied gross hematuria, incomplete bladder emptying, urinary leakage, straining to void or incontinence.                              On review of systems, the patient states that he is doing well in general.  He continues to deny gross hematuria but continues with dysuria, weak stream, intermittency, straining to void and nocturia anywhere from 1-5x/night and variable.  He is taking Flomax daily as prescribed and feels that his LUTS are gradually improving. He denies abdominal pain, incontinence, fever, chills, N/V/D or constipation.  He has a healthy appetite and is maintaining his weight. Overall, he iw pleased with his progress to date. He does not currently have a scheduled  follow up with Urology but will continue to be followed with [facility] Royce at [facility] while he is on the  clinical trial So-Hi: Image-Adapted Target Volumes Using 68Ga-HBED-CC PSMA-PET/MRI for Unfavorable-Risk Prostate Cancer.   ALLERGIES:  is allergic to penicillins.  Meds: Current Outpatient Medications  Medication Sig Dispense Refill  . acetaminophen (TYLENOL) 325 MG tablet Take 650 mg by mouth daily as needed (pain). Rarely takes    . Glucosamine-Chondroit-Vit C-Mn (GLUCOSAMINE 1500 COMPLEX PO) Take by mouth.    Marland Kitchen leuprolide (LUPRON) 22.5 MG injection Inject 22.5 mg into the muscle every 3 (three) months.    . Multiple Vitamin (MULTI-VITAMIN) tablet Take by mouth.    . Omega-3 Fatty Acids (FISH OIL) 1000 MG CAPS Take by mouth.    . tamsulosin (FLOMAX) 0.4 MG CAPS capsule Take by mouth.     No current facility-administered medications for this encounter.    Physical Findings:  vitals were not taken for this visit.   /Unable to assess due to telephone follow up visit format.  Lab Findings: Lab Results  Component Value Date   WBC 4.6 10/02/2015   HGB 14.1 10/02/2015   HCT 40.9 10/02/2015   MCV 94.7 10/02/2015   PLT 198 10/02/2015     Radiographic Findings: No results found.  Impression/Plan: 1. 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    He will continue to follow up with Dr. Ali Lowe in urology  at [facility] for ongoing PSA determinations while he is on clinical trial and has a scheduled follow up visit on 07/15/19.  He understands what to expect with regards to PSA monitoring going forward. I will look forward to following his response to treatment via correspondence with urology, and would be happy to continue to participate in his care if clinically indicated. I talked to the patient about what to expect in the future, including his risk for erectile dysfunction and rectal bleeding. I encouraged him to call or return  to the office if he has any questions regarding his previous radiation or possible radiation side effects. He was comfortable with this plan and will follow up as needed.  Today, a comprehensive survivorship care plan and treatment summary was reviewed with the patient today detailing his prostate cancer diagnosis, treatment course, potential late/long-term effects of treatment, appropriate follow-up care with recommendations for the future, and patient education resources.  A copy of this summary, along with a letter will be sent to the patient's primary care provider via fax after today's visit.  2. Cancer screening:  Due to Mr. Hohlt history and his age, he should receive screening for skin cancers, colon cancer, and lung cancer.  The information and recommendations are listed on the patient's comprehensive care plan/treatment summary and were reviewed in detail with the patient.     3. Health maintenance and wellness promotion: Mr. Huebel was encouraged to consume 5-7 servings of fruits and vegetables per day. He was provided a copy of the "Nutrition Rainbow" handout, as well as the handout "Take Control of Your Health and [place]" from the [place].  He was also encouraged to engage in moderate to vigorous exercise for 30 minutes per day most days of the week. Information was provided regarding the [facility] fitness program, which is designed for cancer survivors to help them become more physically fit after cancer treatments. We discussed that a healthy BMI is 18.5-24.9 and that maintaining a healthy weight reduces risk of cancer recurrences.  He was instructed to limit his alcohol consumption and continue to abstain from tobacco use.  Lastly, he was encouraged to use sunscreen and wear protective clothing when in the sun.     4. Support services/counseling: It is not uncommon for this period of the patient's cancer care trajectory to be one of many emotions  and stressors.  Mr. Welle was encouraged to take advantage of our many support services programs, support groups, and/or counseling in coping with his new life as a cancer survivor after completing anti-cancer treatment.  He was offered support today through active listening and expressive supportive counseling.  He was given information regarding our available services and encouraged to contact me with any questions or for help enrolling in any of our support group/programs.       Nicholos Johns, PA-C

## 2019-07-15 DIAGNOSIS — Y838 Other surgical procedures as the cause of abnormal reaction of the patient, or of later complication, without mention of misadventure at the time of the procedure: Secondary | ICD-10-CM | POA: Diagnosis not present

## 2019-07-15 DIAGNOSIS — C61 Malignant neoplasm of prostate: Secondary | ICD-10-CM | POA: Diagnosis not present

## 2019-07-15 DIAGNOSIS — T839XXA Unspecified complication of genitourinary prosthetic device, implant and graft, initial encounter: Secondary | ICD-10-CM | POA: Diagnosis not present

## 2019-07-19 DIAGNOSIS — D72819 Decreased white blood cell count, unspecified: Secondary | ICD-10-CM | POA: Diagnosis not present

## 2019-07-19 DIAGNOSIS — Z13220 Encounter for screening for lipoid disorders: Secondary | ICD-10-CM | POA: Diagnosis not present

## 2019-07-19 DIAGNOSIS — Z125 Encounter for screening for malignant neoplasm of prostate: Secondary | ICD-10-CM | POA: Diagnosis not present

## 2019-07-24 DIAGNOSIS — E875 Hyperkalemia: Secondary | ICD-10-CM | POA: Diagnosis not present

## 2019-07-24 DIAGNOSIS — I453 Trifascicular block: Secondary | ICD-10-CM | POA: Diagnosis not present

## 2019-07-24 DIAGNOSIS — G250 Essential tremor: Secondary | ICD-10-CM | POA: Diagnosis not present

## 2019-07-24 DIAGNOSIS — M26609 Unspecified temporomandibular joint disorder, unspecified side: Secondary | ICD-10-CM | POA: Diagnosis not present

## 2019-07-24 DIAGNOSIS — I480 Paroxysmal atrial fibrillation: Secondary | ICD-10-CM | POA: Diagnosis not present

## 2019-07-24 DIAGNOSIS — Z0000 Encounter for general adult medical examination without abnormal findings: Secondary | ICD-10-CM | POA: Diagnosis not present

## 2019-07-24 DIAGNOSIS — D649 Anemia, unspecified: Secondary | ICD-10-CM | POA: Diagnosis not present

## 2019-07-24 DIAGNOSIS — D72819 Decreased white blood cell count, unspecified: Secondary | ICD-10-CM | POA: Diagnosis not present

## 2019-07-24 DIAGNOSIS — I517 Cardiomegaly: Secondary | ICD-10-CM | POA: Diagnosis not present

## 2019-07-24 DIAGNOSIS — Z95810 Presence of automatic (implantable) cardiac defibrillator: Secondary | ICD-10-CM | POA: Diagnosis not present

## 2019-08-01 ENCOUNTER — Telehealth: Payer: Self-pay | Admitting: Internal Medicine

## 2019-08-01 NOTE — Telephone Encounter (Signed)
Kevin Castro is calling in regards to a problem that he is having with his neck that he thought was not heart related until recently. He states when his HR goes up it causes pain in his neck. He is a bike rider and the pain causes him to barley be able to lift his head to the point of having to get off the bike and rest so it will go away. A friend that used to work in heart care advised him to talk to Dr. Caryl Comes in regards to it. Please advise.

## 2019-08-02 ENCOUNTER — Ambulatory Visit: Payer: Medicare HMO | Admitting: Internal Medicine

## 2019-08-02 ENCOUNTER — Encounter: Payer: Self-pay | Admitting: Internal Medicine

## 2019-08-02 ENCOUNTER — Other Ambulatory Visit: Payer: Self-pay

## 2019-08-02 DIAGNOSIS — R079 Chest pain, unspecified: Secondary | ICD-10-CM | POA: Diagnosis not present

## 2019-08-02 NOTE — Patient Instructions (Signed)
Medication Instructions:  Your physician recommends that you continue on your current medications as directed. Please refer to the Current Medication list given to you today.  *If you need a refill on your cardiac medications before your next appointment, please call your pharmacy*   Lab Work: None ordered.  If you have labs (blood work) drawn today and your tests are completely normal, you will receive your results only by: Marland Kitchen MyChart Message (if you have MyChart) OR . A paper copy in the mail If you have any lab test that is abnormal or we need to change your treatment, we will call you to review the results.   Testing/Procedures: Your physician has requested that you have cardiac CT. Cardiac computed tomography (CT) is a painless test that uses an x-ray machine to take clear, detailed pictures of your heart. For further information please visit [email or web address]. Please follow instruction sheet as given.     Follow-Up: At [facility], you and your health needs are our priority.  As part of our continuing mission to provide you with exceptional heart care, we have created designated Provider Care Teams.  These Care Teams include your primary Cardiologist (physician) and Advanced Practice Providers (APPs -  Physician Assistants and Nurse Practitioners) who all work together to provide you with the care you need, when you need it.  We recommend signing up for the patient portal called "MyChart".  Sign up information is provided on this After Visit Summary.  MyChart is used to connect with patients for Virtual Visits (Telemedicine).  Patients are able to view lab/test results, encounter notes, upcoming appointments, etc.  Non-urgent messages can be sent to your provider as well.   To learn more about what you can do with MyChart, go to [email or web address].    Your next appointment: To be determined.

## 2019-08-02 NOTE — Telephone Encounter (Signed)
Spoke with pt who is complaining of neck, shoulder and arm pain if he exercises too hard, specifically biking.  Pt states he will have to stop and rest in order to help relieve the pain.  Pt denies current CP or SOB.  Appointment scheduled for today at 230pm with Dr Caryl Comes.  Pt verbalizes understanding and agrees with current plan.

## 2019-08-02 NOTE — Progress Notes (Signed)
Error

## 2019-08-02 NOTE — Progress Notes (Signed)
Patient Care Team: Deland Pretty, MD as PCP - General (Internal Medicine)   HPI  Kevin Castro is a 74 y.o. male Seen in follow-up for atrial fib.  I saw him 2016 and he sought primary therapy with ablation which was done by [facility] 2017 cx by post procedural SOB  Had syncope assoc with AFib 2017  No recurrent syncope.  Some irregularity.  [facility] tracings have suggested A. fib.  They are not here for review  Has been diagnosed with prostate cancer.  He is currently undergoing radiation therapy and hormonal therapy.  He has loss of energy.  He describes an exertional discomfort in his neck and into his left arm.  In his mind it correlates with heart rates faster than 130 and notable mostly on a bike when he is riding in a group.  It abates with rest after   couple of minutes.  He rides with [employer] Quail.  Suggested he get it looked at.    Thromboembolic risk factors ( age -55) for a CHADSVASc Score of 1       Past Medical History:  Diagnosis Date   Atrial tachycardia (HCC)    Paroxysmal atrial fibrillation (HCC)    Prostate CA (HCC)    Sinus bradycardia    Trifascicular block     Past Surgical History:  Procedure Laterality Date   DOBUTAMINE STRESS ECHO     ELECTROPHYSIOLOGIC STUDY     ELECTROPHYSIOLOGIC STUDY N/A 10/16/2015   Procedure: Atrial Fibrillation Ablation;  Surgeon: Thompson Grayer, MD;  Location: [facility];  Service: Cardiovascular;  Laterality: N/A;    Current Outpatient Medications  Medication Sig Dispense Refill   acetaminophen (TYLENOL) 325 MG tablet Take 650 mg by mouth daily as needed (pain). Rarely takes     Glucosamine-Chondroit-Vit C-Mn (GLUCOSAMINE 1500 COMPLEX PO) Take by mouth.     leuprolide (LUPRON) 22.5 MG injection Inject 22.5 mg into the muscle every 3 (three) months.     Multiple Vitamin (MULTI-VITAMIN) tablet Take by mouth.     Omega-3 Fatty Acids (FISH OIL) 1000 MG CAPS Take by mouth.     tamsulosin (FLOMAX) 0.4  MG CAPS capsule Take by mouth.     No current facility-administered medications for this visit.    Allergies  Allergen Reactions   Penicillins Rash    Rash/blisters Has patient had a PCN reaction causing immediate rash, facial/tongue/throat swelling, SOB or lightheadedness with hypotension: Yes Has patient had a PCN reaction causing severe rash involving mucus membranes or skin necrosis: No Has patient had a PCN reaction that required hospitalization No Has patient had a PCN reaction occurring within the last 10 years: No If all of the above answers are "NO", then may proceed with Cephalosporin use.       Review of Systems negative except from HPI and PMH  Physical Exam BP 134/78    Pulse 73    Ht 5' 10.5" (1.791 m)    Wt 193 lb 6.4 oz (87.7 kg)    SpO2 98%    BMI 27.36 kg/m  Well developed and nourished in no acute distress HENT normal Neck supple with JVP-  flat   Clear Regular rate and rhythm, no murmurs or gallops Abd-soft with active BS No Clubbing cyanosis edema Skin-warm and dry A & Oriented  Grossly normal sensory and motor function  ECG   Assessment and  Plan  Bifascicular block  Exertional neck pain  Chest pain-pleuritic  Palpitations-- frequent atrial  ectopy   Atrial fibrillation no interval that we are aware of  Syncope-neurally mediated    Intermittent atrial fibrillation in the past, none symptomatically of late.  Exertional neck discomfort is concerning for coronary disease.  Reviewing his studies he had a calcium score of zero 2015.  No other imaging of his chest  We will also need to think about alternative explanations for wraps related to perfusion of the muscles of the neck.  I suspect this much less likely.     We spent more than 50% of our >30 min visit in face to face counseling regarding the above      Current medicines are reviewed at length with the patient today .  The patient does not  have concerns regarding medicines.

## 2019-08-09 ENCOUNTER — Telehealth: Payer: Self-pay | Admitting: Radiation Oncology

## 2019-08-09 NOTE — Telephone Encounter (Signed)
-----   Message from Kerri Perches sent at 08/07/2019  2:52 PM EDT ----- Regarding: INJECTION Hi Kevin Castro,   This patient called and is wanting someone to call him, he has a knot where he received his Eligard injection.  I asked him if the urologist gave him the injection and he told me no, that he received this injection @ [facility].  Kevin Castro told me that he was going to call [facility], but he was wanting a call from [place] also.  Kevin Castro number is [phone].  Thanks,  [employer]

## 2019-08-09 NOTE — Telephone Encounter (Signed)
Received inbasket message from Kevin Castro that this patient requested a return call from this RN. Phoned patient to inquire. Patient reports having a very small knot at the site of his Lupron injection. Patient denies fever or pain in this same area. Explained this is not uncommon following this injection and encouraged he reach out to [facility] should it change. However, explained this will most likely resolve on its own. Patient verbalized understanding and expressed appreciation for the return call.

## 2019-08-16 ENCOUNTER — Telehealth: Payer: Self-pay | Admitting: Radiation Oncology

## 2019-08-16 NOTE — Telephone Encounter (Signed)
Received email from Romie Jumper that patient is upset a coronary scan hasn't been scheduled and he has been waiting three weeks. Dr. Tammi Klippel treated the patient for prostate cancer and he has since had his follow up appointment. Patient has been release back to the care of his urologist. Phoned patient to inquire further. No answer. Left voicemail message with my direct number offering assistance. Awaiting return call.

## 2019-08-16 NOTE — Telephone Encounter (Signed)
Received voicemail message from where patient called back. Phoned him back promptly. Discovered that patient was attempting to reach another [place] to schedule a CT coronary flow and was transferred to the wrong department. Assisted patient to the best of my ability. Patient verbalized understanding and appreciation for the call back.

## 2019-08-20 DIAGNOSIS — H40023 Open angle with borderline findings, high risk, bilateral: Secondary | ICD-10-CM | POA: Diagnosis not present

## 2019-08-21 ENCOUNTER — Other Ambulatory Visit: Payer: Self-pay

## 2019-08-21 ENCOUNTER — Telehealth: Payer: Self-pay | Admitting: Internal Medicine

## 2019-08-21 ENCOUNTER — Other Ambulatory Visit: Payer: Medicare HMO | Admitting: *Deleted

## 2019-08-21 DIAGNOSIS — Z01812 Encounter for preprocedural laboratory examination: Secondary | ICD-10-CM | POA: Diagnosis not present

## 2019-08-21 DIAGNOSIS — R079 Chest pain, unspecified: Secondary | ICD-10-CM

## 2019-08-21 NOTE — Telephone Encounter (Signed)
Spoke with pt and advised need for BMET lab prior to CT morph.  Order placed and appt scheduled for today 08/21/2019 at 130pm.  Pt verbalizes understanding and agrees with current plan.

## 2019-08-21 NOTE — Telephone Encounter (Signed)
Patient needs labs prior to CT morph. I do not see any lab orders in - please advise.

## 2019-08-22 LAB — BASIC METABOLIC PANEL
BUN/Creatinine Ratio: 17 (ref 10–24)
BUN: 18 mg/dL (ref 8–27)
CO2: 29 mmol/L (ref 20–29)
Calcium: 9.8 mg/dL (ref 8.6–10.2)
Chloride: 100 mmol/L (ref 96–106)
Creatinine, Ser: 1.09 mg/dL (ref 0.76–1.27)
GFR calc Af Amer: 78 mL/min/{1.73_m2} (ref >59)
GFR calc non Af Amer: 67 mL/min/{1.73_m2} (ref >59)
Glucose: 90 mg/dL (ref 65–99)
Potassium: 4.4 mmol/L (ref 3.5–5.2)
Sodium: 139 mmol/L (ref 134–144)

## 2019-08-27 ENCOUNTER — Telehealth (HOSPITAL_COMMUNITY): Payer: Self-pay | Admitting: *Deleted

## 2019-08-27 NOTE — Telephone Encounter (Signed)
Pt returning call regarding upcoming cardiac imaging study; pt verbalizes understanding of appt date/time, parking situation and where to check in, pre-test NPO status and medications ordered, and verified current allergies; name and call back number provided for further questions should they arise  Asani Deniston Tai RN Navigator Cardiac Imaging [facility] [phone] office [phone] cell  

## 2019-08-27 NOTE — Telephone Encounter (Signed)
Attempted to call patient regarding upcoming cardiac CT appointment. °Left message on voicemail with name and callback number ° °Tanetta Fuhriman Tai RN Navigator Cardiac Imaging °[facility] °[phone] Office °[phone] Cell °

## 2019-08-28 ENCOUNTER — Other Ambulatory Visit: Payer: Self-pay

## 2019-08-28 ENCOUNTER — Ambulatory Visit (HOSPITAL_COMMUNITY)
Admission: RE | Admit: 2019-08-28 | Discharge: 2019-08-28 | Disposition: A | Discharge status: Home / Self Care | Payer: Medicare HMO | Source: Ambulatory Visit | Attending: Internal Medicine | Admitting: Internal Medicine

## 2019-08-28 DIAGNOSIS — I700 Atherosclerosis of aorta: Secondary | ICD-10-CM | POA: Diagnosis not present

## 2019-08-28 DIAGNOSIS — R079 Chest pain, unspecified: Secondary | ICD-10-CM | POA: Insufficient documentation

## 2019-08-28 DIAGNOSIS — I2510 Atherosclerotic heart disease of native coronary artery without angina pectoris: Secondary | ICD-10-CM | POA: Diagnosis not present

## 2019-08-28 MED ORDER — NITROGLYCERIN 0.4 MG SL SUBL: 0.8000 mg | SUBLINGUAL_TABLET | SUBLINGUAL | Status: DC | PRN

## 2019-08-28 MED ORDER — METOPROLOL TARTRATE 5 MG/5ML IV SOLN: 5.0000 mg | Freq: Once | INTRAVENOUS | Status: DC

## 2019-08-28 MED ORDER — METOPROLOL TARTRATE 5 MG/5ML IV SOLN
INTRAVENOUS | Status: AC
  Administered 2019-08-28: 5 mg
  Filled 2019-08-28: qty 5

## 2019-08-28 MED ORDER — IOHEXOL 350 MG/ML SOLN
100.0000 mL | Freq: Once | INTRAVENOUS | Status: AC | PRN
  Administered 2019-08-28: 100 mL via INTRAVENOUS

## 2019-08-28 MED ORDER — NITROGLYCERIN 0.4 MG SL SUBL
SUBLINGUAL_TABLET | SUBLINGUAL | Status: AC
  Administered 2019-08-28: 0.8 mg
  Filled 2019-08-28: qty 2

## 2019-08-28 MED ORDER — SODIUM CHLORIDE 0.9 % IV BOLUS
1000.0000 mL | Freq: Once | INTRAVENOUS | Status: AC
  Administered 2019-08-28: 1000 mL via INTRAVENOUS

## 2019-08-28 NOTE — Progress Notes (Signed)
Patient in reclined position.  Denies any dizziness or light headedness.  Bp is 87/52.  Will continue to monitor.

## 2019-08-28 NOTE — Progress Notes (Signed)
Patient returned from [place].  BP 67/45.  Patient states a little lightheaded at this time.  Spoke with Dr Marlou Porch, verbal orders for Normal Saline 1L bolus.  Verbal orders read back and verified.  Patient given blanket, water and chair reclined.  Will continue to monitor BP and patient for any new symptoms.

## 2019-08-28 NOTE — Progress Notes (Signed)
CT scan completed. Tolerated well. D/C  Home ambulatory, awake and alert. In no distress

## 2019-10-14 DIAGNOSIS — C61 Malignant neoplasm of prostate: Secondary | ICD-10-CM | POA: Diagnosis not present

## 2019-10-14 DIAGNOSIS — Y838 Other surgical procedures as the cause of abnormal reaction of the patient, or of later complication, without mention of misadventure at the time of the procedure: Secondary | ICD-10-CM | POA: Diagnosis not present

## 2019-10-14 DIAGNOSIS — T839XXA Unspecified complication of genitourinary prosthetic device, implant and graft, initial encounter: Secondary | ICD-10-CM | POA: Diagnosis not present

## 2019-10-28 DIAGNOSIS — R69 Illness, unspecified: Secondary | ICD-10-CM | POA: Diagnosis not present

## 2019-10-29 DIAGNOSIS — R69 Illness, unspecified: Secondary | ICD-10-CM | POA: Diagnosis not present

## 2019-11-13 ENCOUNTER — Telehealth: Payer: Self-pay | Admitting: Radiation Oncology

## 2019-11-13 DIAGNOSIS — R69 Illness, unspecified: Secondary | ICD-10-CM | POA: Diagnosis not present

## 2019-11-13 NOTE — Telephone Encounter (Signed)
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     03/11/19- upfront brachytherapy boost   04/23/19 - 05/27/19- 25 fractions of EBRT to the prostate, seminal vesicles and pelvic lymph nodes  Received call from patient considering "switching all treatment to [facility]." Patient explains he has received mixed treatment at [facility] and [facility]. Explains he has been a part of a clinical trial at [facility] under the care of Dr. Lissa Morales. Explains Dr. Ali Lowe has left [facility] and he is uncertain if anyone will be taking his place. Explains he is "finishing up ADT" now after receiving four injections. Reports he was seen by Dr. Diona Fanti last August but not since nor did he have an upcoming appointment.   Patient verbalized that he is concerned about effects of ADT such as loss of muscle mass, suppressed testosterone and "if a follow up MRI will be done."   Patient understands this RN will relay this information to his providers and phone back with direction.

## 2019-11-13 NOTE — Telephone Encounter (Signed)
Yes- you are correct. Sounds like he needs to get back in with Dr. Diona Fanti to discuss his sxs associated with ADT. Maybe Cira Rue, RN can help expedite the appointment... -Janyla Biscoe

## 2019-11-13 NOTE — Telephone Encounter (Signed)
Phoned patient. No answer. Left detailed message verbalizing the recommendation he follow up with Dr. Diona Fanti. Provided Dr. Alan Ripper office number. Also, explained if he had difficulty re-establishing care to please reach out to our prostate navigator, Cira Rue, at [phone] for assistance.

## 2019-12-12 DIAGNOSIS — M25561 Pain in right knee: Secondary | ICD-10-CM | POA: Diagnosis not present

## 2020-01-14 DIAGNOSIS — C61 Malignant neoplasm of prostate: Secondary | ICD-10-CM | POA: Diagnosis not present

## 2020-01-14 DIAGNOSIS — Z5111 Encounter for antineoplastic chemotherapy: Secondary | ICD-10-CM | POA: Diagnosis not present

## 2020-02-03 DIAGNOSIS — L814 Other melanin hyperpigmentation: Secondary | ICD-10-CM | POA: Diagnosis not present

## 2020-02-03 DIAGNOSIS — D1801 Hemangioma of skin and subcutaneous tissue: Secondary | ICD-10-CM | POA: Diagnosis not present

## 2020-02-03 DIAGNOSIS — L821 Other seborrheic keratosis: Secondary | ICD-10-CM | POA: Diagnosis not present

## 2020-02-03 DIAGNOSIS — L570 Actinic keratosis: Secondary | ICD-10-CM | POA: Diagnosis not present

## 2020-02-19 DIAGNOSIS — C61 Malignant neoplasm of prostate: Secondary | ICD-10-CM | POA: Diagnosis not present

## 2020-02-20 DIAGNOSIS — H40023 Open angle with borderline findings, high risk, bilateral: Secondary | ICD-10-CM | POA: Diagnosis not present

## 2020-02-20 DIAGNOSIS — H524 Presbyopia: Secondary | ICD-10-CM | POA: Diagnosis not present

## 2020-02-20 DIAGNOSIS — H5213 Myopia, bilateral: Secondary | ICD-10-CM | POA: Diagnosis not present

## 2020-02-20 DIAGNOSIS — H2513 Age-related nuclear cataract, bilateral: Secondary | ICD-10-CM | POA: Diagnosis not present

## 2020-03-19 DIAGNOSIS — Z0100 Encounter for examination of eyes and vision without abnormal findings: Secondary | ICD-10-CM | POA: Diagnosis not present

## 2020-04-15 DIAGNOSIS — C61 Malignant neoplasm of prostate: Secondary | ICD-10-CM | POA: Diagnosis not present

## 2020-05-06 DIAGNOSIS — C61 Malignant neoplasm of prostate: Secondary | ICD-10-CM | POA: Diagnosis not present

## 2020-05-06 DIAGNOSIS — R3915 Urgency of urination: Secondary | ICD-10-CM | POA: Diagnosis not present

## 2020-05-06 DIAGNOSIS — R350 Frequency of micturition: Secondary | ICD-10-CM | POA: Diagnosis not present

## 2020-05-06 DIAGNOSIS — R351 Nocturia: Secondary | ICD-10-CM | POA: Diagnosis not present

## 2020-05-06 DIAGNOSIS — N401 Enlarged prostate with lower urinary tract symptoms: Secondary | ICD-10-CM | POA: Diagnosis not present

## 2020-06-01 ENCOUNTER — Telehealth: Payer: Self-pay | Admitting: Student

## 2020-06-01 DIAGNOSIS — Z95810 Presence of automatic (implantable) cardiac defibrillator: Secondary | ICD-10-CM | POA: Diagnosis not present

## 2020-06-01 DIAGNOSIS — R55 Syncope and collapse: Secondary | ICD-10-CM | POA: Diagnosis not present

## 2020-06-01 NOTE — Telephone Encounter (Signed)
Received a call from [place] regarding patient. Says that they have sent in a STAT referral in. Sent call to triage.

## 2020-06-01 NOTE — Progress Notes (Signed)
Cardiology Office Note Date:  06/02/2020  Patient ID:  Kevin Castro, Kevin Castro 1945/05/21, MRN [id number] PCP:  Deland Pretty, MD  Electrophysiologist: Dr. Caryl Comes    Chief Complaint: syncope  History of Present Illness: Kevin Castro is a 75 y.o. male with history of AFib, prostate Ca (s/p radiation/hormonal tx), PAT  He comes in today to be seen for Dr. Caryl Comes, last seen by him July 2021.  He discussed hx of syncope associated with Afib, s/p PVI ablation without recurrent syncope. At that visit was biking regularly and reported an exertional L neck/arm discomfort resolved with rest. Known conduction system disease with bifscicular block by his note. Palpitations with frequent atrial ectopy, and his syncope reported neurally mediated. Seems he had Ca++ score done in 2015 with a score of zero, planned for coronary angio. This was completed with no obstructive disease.  Pt is an avid Administrator, sports.  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 Caryl Comes 2/2 a syncopal event that occurred while running a half marathon.  In review of historical notes including Dr. Rayann Heman (back in 2018), discussion of hx of syncope and trifascicular block PPM indications though the patient declined Dr. Caryl Comes discussed perhaps loop as an option.  TODAY He comes in today referred urgently from his PMD after suffering a syncopal event during a 1/2 marathon he was running.They had  Monitor placed , started wearing it yesterday.  He tells me that he has felt very well, no symptoms of any kind, doing his usual training and activities (cycling and running) with no changes in his exertional capacity No CP, palpitations or cardiac awareness. No lightheaded spells, no near syncope or syncope since his Af ablation until Saturday at the race and that event much different then his prior events.  He says with his Afib he had warning and never had full syncope years back though close, and  these have not recurred and felt 2/2 his AF  Saturday he was running a 1/2 marathon, mentions planned strategy was to push his pace as fast as he could.  He was about 1/2 way into the race and he fainted.  No warnong, did hit his head he thinks on a tree root.  He thinks he was only out a few seconds, as he was waking/getting up a runner behind hm was asking if he was OK. He felt well immediately afterwards.  Yesterday the day after fatigued though as he usually feels after a race.  He has not had any symptoms since of any kind.  He was wearing a garmin chest HR strap and shows me the graphic, noting a HR consistently 140's with a very sharp drop in HR that he says correlates with the time of his syncopal event     Afib Hx Atach goes back to the beginning of his epic cardiac records, reportedly neg EPS in 2015. AFib is mentioned to go back that far as well. PVI/CTI ablation 10/16/2015  AAD Hx None to date  Past Medical History:  Diagnosis Date  . Atrial tachycardia ([place])   . Paroxysmal atrial fibrillation ([facility])   . Prostate CA ([place])   . Sinus bradycardia   . Trifascicular block     Past Surgical History:  Procedure Laterality Date  . DOBUTAMINE STRESS ECHO    . ELECTROPHYSIOLOGIC STUDY    . ELECTROPHYSIOLOGIC STUDY N/A 10/16/2015   Procedure: Atrial Fibrillation Ablation;  Surgeon: Thompson Grayer, MD;  Location: [facility]  INVASIVE CV LAB;  Service: Cardiovascular;  Laterality: N/A;    Current Outpatient Medications  Medication Sig Dispense Refill  . acetaminophen (TYLENOL) 325 MG tablet Take 650 mg by mouth daily as needed (pain). Rarely takes    . CALCIUM-VITAMIN D PO Take by mouth. Per patient doesn't know mg using OTC    . Glucosamine-Chondroit-Vit C-Mn (GLUCOSAMINE 1500 COMPLEX PO) Take by mouth.    . Multiple Vitamin (MULTI-VITAMIN) tablet Take by mouth.    . Omega-3 Fatty Acids (FISH OIL) 1000 MG CAPS Take by mouth.     No current facility-administered medications for this  visit.    Allergies:   Penicillins   Social History:  The patient  reports that he quit smoking about 47 years ago. His smoking use included cigarettes. He has a 5.25 pack-year smoking history. He has never used smokeless tobacco. He reports current alcohol use of about 12.0 standard drinks of alcohol per week. He reports that he does not use drugs.   Family History:  The patient's family history includes Arrhythmia in his brother, mother, and sister; Breast cancer in his cousin; Diabetes in his brother; Liver disease in his father; Lung cancer in his father; Stroke in his mother.  ROS:  Please see the history of present illness.    All other systems are reviewed and otherwise negative.   PHYSICAL EXAM:  VS:  BP 120/80   Pulse 73   Ht 5\' 10"  (1.778 m)   Wt 195 lb 9.6 oz (88.7 kg)   SpO2 98%   BMI 28.07 kg/m  BMI: Body mass index is 28.07 kg/m. Well nourished, well developed, in no acute distress HEENT: normocephalic, atraumatic Neck: no JVD, carotid bruits or masses Cardiac:  RRR; no significant murmurs, no rubs, or gallops Lungs:  CTA b/l, no wheezing, rhonchi or rales Abd: soft, nontender MS: no deformity or atrophy Ext:  no edema Skin: warm and dry, no rash Neuro:  No gross deficits appreciated Psych: euthymic mood, full affect   EKG:  Done today and reviewed by myself shows  SR/sinus arrhythmia, 73bpm, 1st degree Avblock pr 268ms, RBBB, LAD  08/28/2019: Coronary CT IMPRESSION: 1. Coronary calcium score of 0. This was 0 percentile for age and sex matched control. There was no calcified plaque.  2. Normal coronary origin with right dominance.  3. There is a mild proximal PDA stenosis of 0-24%, non calcified plaque, that is non flow limiting.  4. CAD-RADS 1. Mild non-obstructive CAD (0-24%). Consider non-atherosclerotic causes of chest pain. Consider preventive therapy and risk factor modification.  10/16/2015: EPS/ablation (Dr. Rayann Heman) CONCLUSIONS: 1. Sinus  rhythm upon presentation.   2. Intracardiac echo reveals a moderate sized left atrium with four separate pulmonary veins without evidence of pulmonary vein stenosis. 3. Successful electrical isolation and anatomical encircling of all four pulmonary veins with radiofrequency current.    4. Cavo-tricuspid isthmus ablation was performed with complete bidirectional isthmus block achieved.  5. No sustained arrhythmias following ablation both on and off of Isuprel 6. No early apparent complications.    07/13/2015: TTE Study Conclusions  - Left ventricle: The cavity size was at the upper limits of  normal. Wall thickness was normal. Systolic function was normal.  The estimated ejection fraction was in the range of 55% to 60%.  Left ventricular diastolic function parameters were normal.  - Mitral valve: Systolic bowing without prolapse. There was mild  regurgitation directed centrally.  - Left atrium: The atrium was mildly dilated.  - Right ventricle:  The cavity size was mildly dilated.  - Right atrium: The atrium was mildly dilated.    Recent Labs: 08/21/2019: BUN 18; Creatinine, Ser 1.09; Potassium 4.4; Sodium 139  No results found for requested labs within last 8760 hours.   CrCl cannot be calculated (Patient's most recent lab result is older than the maximum 21 days allowed.).   Wt Readings from Last 3 Encounters:  06/02/20 195 lb 9.6 oz (88.7 kg)  08/02/19 193 lb 6.4 oz (87.7 kg)  04/12/19 190 lb (86.2 kg)     Other studies reviewed: Additional studies/records reviewed today include: summarized above  ASSESSMENT AND PLAN:  1. Syncope     Long hx of conduction system diseaes, trifascicular block     Lengthy discussion today recommending PPM implant and rational for it and that his conduction system disease will not get better and likely over time continue to worsen  He is not on any nodal blocking agents  He is very hesitant and not quite ready to commit to pacing at  this time He will continue the current monitor to completion I have d/w DOD today, given the pt declines pacing would have him see Dr. Caryl Comes ASAP, advise no driving Discussed symptoms to be watchful for and to seek medical attention for Advised to avoid strenuous activities until seen by Dr. Simon Rhein no driving for 6 months or until cleared by the MD to do so also advised to avoid high risk activities   2. Paroxysmal Afib     S/p PVI/CTI ablation 2017     CHA2DS2Vasc is one for age, not on a/c     Rarely feels palpitations, thinks maybe a year or mor ago when cycling had an hour or so of Af   Disposition: F/u with next week as scheduled  Current medicines are reviewed at length with the patient today.  The patient did not have any concerns regarding medicines.  Venetia Night, PA-C 06/02/2020 1:21 PM     [address] [place] [address] [phone] (office)  [phone] (fax)

## 2020-06-01 NOTE — Telephone Encounter (Signed)
Spoke with pt and advised of new STAT referral received from Dr Pennie Banter office.  Pt advised his record has been reviewed by Oda Kilts, PA-C and with Dr Lars Mage.  It was previously recommended that pt have a PPM placed d/t trifascicular block.  Pt advised per Mr Chalmers Cater and [place] recommendations pt should not drive for 6 months after passing out.  Appointment scheduled with Tommye Standard, PA-C tomorrow 06/02/2020 to revisit pacemaker discussion.  Reviewed ED precautions.  Pt verbalizes understanding and agrees with current plan.

## 2020-06-01 NOTE — Telephone Encounter (Signed)
Pt c/o Syncope: STAT if syncope occurred within 30 minutes and pt complains of lightheadedness High Priority if episode of passing out, completely, today or in last 24 hours   1. Did you pass out today? No   2. When is the last time you passed out? 05/30/20  3. Has this occurred multiple times? Yes, but it was 2-3 years ago   4. Did you have any symptoms prior to passing out? No  Kevin Castro is calling stating Saturday 05/30/20 he was running a half marathon and ended up passing out. He states no symptoms occurred prior, and he experienced memory loss after coming to. He states he does not remember anything seconds before passing out, but woke up with his head bleeding due to hitting it on a root when falling. After the fall he walked the remaining 6 miles to complete the marathon. He had an appt with his PCP today and he was put on a 7 day heart monitor. Dr. Shelia Media advised him to schedule a f/u for after the results are received which has been scheduled for 06/22/20 with Kevin Castro. He plans to reschedule if results are not in by then. Also states Dr. Shelia Media advised him to consult with Dr. Caryl Comes on how hard he can push himself with exercise. Please advise.

## 2020-06-01 NOTE — Telephone Encounter (Signed)
Spoke with pt and advised Dr Caryl Comes is out of the office until next week.  Will send request to Oda Kilts, PA-C but advised pt PA may not be able to fully weigh in on exercise until monitor results are reviewed and pt is further evaluated in office.  Pt states Dr Shelia Media advised him not to push too hard until after he has completed monitor.  Pt states he agreed to this but will not be able to completely stop.  Reviewed ED precautions.  Pt verbalizes understanding and agrees with current plan.

## 2020-06-02 ENCOUNTER — Other Ambulatory Visit: Payer: Self-pay

## 2020-06-02 ENCOUNTER — Encounter: Payer: Self-pay | Admitting: Physician Assistant

## 2020-06-02 ENCOUNTER — Ambulatory Visit: Payer: Medicare HMO | Admitting: Physician Assistant

## 2020-06-02 VITALS — BP 120/80 | HR 73 | Ht 70.0 in | Wt 195.6 lb

## 2020-06-02 DIAGNOSIS — R55 Syncope and collapse: Secondary | ICD-10-CM

## 2020-06-02 DIAGNOSIS — I480 Paroxysmal atrial fibrillation: Secondary | ICD-10-CM

## 2020-06-02 DIAGNOSIS — I453 Trifascicular block: Secondary | ICD-10-CM

## 2020-06-02 NOTE — Patient Instructions (Addendum)
Medication Instructions:   Your physician recommends that you continue on your current medications as directed. Please refer to the Current Medication list given to you today.   *If you need a refill on your cardiac medications before your next appointment, please call your pharmacy*   Lab Work: [place]   If you have labs (blood work) drawn today and your tests are completely normal, you will receive your results only by: Marland Kitchen MyChart Message (if you have MyChart) OR . A paper copy in the mail If you have any lab test that is abnormal or we need to change your treatment, we will call you to review the results.   Testing/Procedures: NONE ORDERED  TODAY   Follow-Up: At [facility], you and your health needs are our priority.  As part of our continuing mission to provide you with exceptional heart care, we have created designated Provider Care Teams.  These Care Teams include your primary Cardiologist (physician) and Advanced Practice Providers (APPs -  Physician Assistants and Nurse Practitioners) who all work together to provide you with the care you need, when you need it.  We recommend signing up for the patient portal called "MyChart".  Sign up information is provided on this After Visit Summary.  MyChart is used to connect with patients for Virtual Visits (Telemedicine).  Patients are able to view lab/test results, encounter notes, upcoming appointments, etc.  Non-urgent messages can be sent to your provider as well.   To learn more about what you can do with MyChart, go to [email or web address].    Your next appointment:  AS SCHEDULED WITH DR Caryl Comes    Other Instructions    ANY REOCCURRING FAINTING TO CONSULT MEDICAL ATTENTION PLEASE   PLEASE NO DRIVING UNTIL CLEARED BY A MEDICAL PROVIDER ( PREFERABLY 6 MONTHS )

## 2020-06-03 NOTE — Telephone Encounter (Signed)
Renee agree with you.   Rogelio Seen   greetings from [place]    I have not spoken who this man who passed out while running   with a documentation by Garmin watch of an abrupt loss of heart rate.  He is reluctant to get a pacer, and I think he fears it will cramp his style  if his concern is that with his running would y be willing to discuss with him your running experience with a pacer Thanks SK

## 2020-06-04 DIAGNOSIS — G44319 Acute post-traumatic headache, not intractable: Secondary | ICD-10-CM | POA: Diagnosis not present

## 2020-06-05 ENCOUNTER — Other Ambulatory Visit: Payer: Self-pay

## 2020-06-05 ENCOUNTER — Ambulatory Visit
Admission: RE | Admit: 2020-06-05 | Discharge: 2020-06-05 | Disposition: A | Discharge status: Home / Self Care | Payer: Medicare HMO | Source: Ambulatory Visit | Attending: Internal Medicine | Admitting: Internal Medicine

## 2020-06-05 ENCOUNTER — Other Ambulatory Visit: Payer: Self-pay | Admitting: Internal Medicine

## 2020-06-05 DIAGNOSIS — G44319 Acute post-traumatic headache, not intractable: Secondary | ICD-10-CM

## 2020-06-05 DIAGNOSIS — R55 Syncope and collapse: Secondary | ICD-10-CM | POA: Diagnosis not present

## 2020-06-05 DIAGNOSIS — R519 Headache, unspecified: Secondary | ICD-10-CM | POA: Diagnosis not present

## 2020-06-10 ENCOUNTER — Encounter: Payer: Self-pay | Admitting: Internal Medicine

## 2020-06-10 ENCOUNTER — Other Ambulatory Visit: Payer: Self-pay

## 2020-06-10 ENCOUNTER — Other Ambulatory Visit: Payer: Self-pay | Admitting: Internal Medicine

## 2020-06-10 ENCOUNTER — Ambulatory Visit: Payer: Medicare HMO | Admitting: Internal Medicine

## 2020-06-10 VITALS — BP 120/74 | HR 74 | Ht 70.0 in | Wt 197.8 lb

## 2020-06-10 DIAGNOSIS — R002 Palpitations: Secondary | ICD-10-CM | POA: Diagnosis not present

## 2020-06-10 DIAGNOSIS — I480 Paroxysmal atrial fibrillation: Secondary | ICD-10-CM | POA: Diagnosis not present

## 2020-06-10 DIAGNOSIS — R55 Syncope and collapse: Secondary | ICD-10-CM

## 2020-06-10 DIAGNOSIS — Z01812 Encounter for preprocedural laboratory examination: Secondary | ICD-10-CM | POA: Diagnosis not present

## 2020-06-10 DIAGNOSIS — I452 Bifascicular block: Secondary | ICD-10-CM | POA: Diagnosis not present

## 2020-06-10 NOTE — Patient Instructions (Signed)
Medication Instructions:  Your physician recommends that you continue on your current medications as directed. Please refer to the Current Medication list given to you today.  *If you need a refill on your cardiac medications before your next appointment, please call your pharmacy*   Lab Work: CBC and BMET today  If you have labs (blood work) drawn today and your tests are completely normal, you will receive your results only by: Marland Kitchen MyChart Message (if you have MyChart) OR . A paper copy in the mail If you have any lab test that is abnormal or we need to change your treatment, we will call you to review the results.   Testing/Procedures: Your physician has recommended that you have a pacemaker inserted. A pacemaker is a small device that is placed under the skin of your chest or abdomen to help control abnormal heart rhythms. This device uses electrical pulses to prompt the heart to beat at a normal rate. Pacemakers are used to treat heart rhythms that are too slow. Wire (leads) are attached to the pacemaker that goes into the chambers of you heart. This is done in the hospital and usually requires and overnight stay. Please see the instruction sheet given to you today for more information.    Follow-Up: At [facility], you and your health needs are our priority.  As part of our continuing mission to provide you with exceptional heart care, we have created designated Provider Care Teams.  These Care Teams include your primary Cardiologist (physician) and Advanced Practice Providers (APPs -  Physician Assistants and Nurse Practitioners) who all work together to provide you with the care you need, when you need it.  We recommend signing up for the patient portal called "MyChart".  Sign up information is provided on this After Visit Summary.  MyChart is used to connect with patients for Virtual Visits (Telemedicine).  Patients are able to view lab/test results, encounter notes, upcoming  appointments, etc.  Non-urgent messages can be sent to your provider as well.   To learn more about what you can do with MyChart, go to [email or web address].    Your next appointment:   To be scheduled   Pacemaker Implantation, Adult Pacemaker implantation is a procedure to place a pacemaker inside the chest. A pacemaker is a small computer that sends electrical signals to the heart and helps the heart beat normally. A pacemaker also stores information about heart rhythms. You may need pacemaker implantation if you have:  A slow heartbeat (bradycardia).  Loss of consciousness that happens repeatedly (syncope) or repeated episodes of dizziness or light-headedness because of an irregular heart rate.  Shortness of breath (dyspnea) due to heart problems. The pacemaker usually attaches to your heart through a wire called a lead. One or two leads may be needed. There are different types of pacemakers:  Transvenous pacemaker. This type is placed under the skin or muscle of your upper chest area. The lead goes through a vein in the chest area to reach the inside of the heart.  Epicardial pacemaker. This type is placed under the skin or muscle of your chest or abdomen. The lead goes through your chest to the outside of the heart. Tell a health care provider about:  Any allergies you have.  All medicines you are taking, including vitamins, herbs, eye drops, creams, and over-the-counter medicines.  Any problems you or family members have had with anesthetic medicines.  Any blood or bone disorders you have.  Any surgeries you have had.  Any medical conditions you have.  Whether you are pregnant or may be pregnant. What are the risks? Generally, this is a safe procedure. However, problems may occur, including:  Infection.  Bleeding.  Failure of the pacemaker or the lead.  Collapse of a lung or bleeding into a lung.  Blood clot inside a blood vessel with a lead.  Damage to the  heart.  Infection inside the heart (endocarditis).  Allergic reactions to medicines. What happens before the procedure? Staying hydrated Follow instructions from your health care provider about hydration, which may include:  Up to 2 hours before the procedure - you may continue to drink clear liquids, such as water, clear fruit juice, black coffee, and plain tea.   Eating and drinking restrictions Follow instructions from your health care provider about eating and drinking, which may include:  8 hours before the procedure - stop eating heavy meals or foods, such as meat, fried foods, or fatty foods.  6 hours before the procedure - stop eating light meals or foods, such as toast or cereal.  6 hours before the procedure - stop drinking milk or drinks that contain milk.  2 hours before the procedure - stop drinking clear liquids. Medicines Ask your health care provider about:  Changing or stopping your regular medicines. This is especially important if you are taking diabetes medicines or blood thinners.  Taking medicines such as aspirin and ibuprofen. These medicines can thin your blood. Do not take these medicines unless your health care provider tells you to take them.  Taking over-the-counter medicines, vitamins, herbs, and supplements. Tests You may have:  A heart evaluation. This may include: ? An electrocardiogram (ECG). This involves placing patches on your skin to check your heart rhythm. ? A chest X-ray. ? An echocardiogram. This is a test that uses sound waves (ultrasound) to produce an image of the heart. ? A cardiac rhythm monitor. This is used to record your heart rhythm and any events for a longer period of time.  Blood tests.  Genetic testing. General instructions  Do not use any products that contain nicotine or tobacco for at least 4 weeks before the procedure. These products include cigarettes, e-cigarettes, and chewing tobacco. If you need help quitting, ask  your health care provider.  Ask your health care provider: ? How your surgery site will be marked. ? What steps will be taken to help prevent infection. These steps may include:  Removing hair at the surgery site.  Washing skin with a germ-killing soap.  Receiving antibiotic medicine.  Plan to have someone take you home from the hospital or clinic.  If you will be going home right after the procedure, plan to have someone with you for 24 hours. What happens during the procedure?  An IV will be inserted into one of your veins.  You will be given one or more of the following: ? A medicine to help you relax (sedative). ? A medicine to numb the area (local anesthetic). ? A medicine to make you fall asleep (general anesthetic).  The next steps vary depending on the type of pacemaker you will be getting. ? If you are getting a transvenous pacemaker:  An incision will be made in your upper chest.  A pocket will be made for the pacemaker. It may be placed under the skin or between layers of muscle.  The lead will be inserted into a blood vessel that goes to the heart.  While X-rays are taken by an  imaging machine (fluoroscopy), the lead will be advanced through the vein to the inside of your heart.  The other end of the lead will be tunneled under the skin and attached to the pacemaker. ? If you are getting an epicardial pacemaker:  An incision will be made near your ribs or breastbone (sternum) for the lead.  The lead will be attached to the outside of your heart.  Another incision will be made in your chest or upper abdomen to create a pocket for the pacemaker.  The free end of the lead will be tunneled under the skin and attached to the pacemaker.  The transvenous or epicardial pacemaker will be tested. Imaging studies may be done to check the lead position.  The incisions will be closed with stitches (sutures), adhesive strips, or skin glue.  Bandages (dressings) will be  placed over the incisions. The procedure may vary among health care providers and hospitals. What happens after the procedure?  Your blood pressure, heart rate, breathing rate, and blood oxygen level will be monitored until you leave the hospital or clinic.  You may be given antibiotics.  You will be given pain medicine.  An ECG and chest X-rays will be done.  You may need to wear a continuous type of ECG (Holter monitor) to check your heart rhythm.  Your health care provider will program the pacemaker.  If you were given a sedative during the procedure, it can affect you for several hours. Do not drive or operate machinery until your health care provider says that it is safe.  You will be given a pacemaker identification card. This card lists the implant date, device model, and manufacturer of your pacemaker. Summary  A pacemaker is a small computer that sends electrical signals to the heart and helps the heart beat normally.  There are different types of pacemakers. A pacemaker may be placed under the skin or muscle of your chest or abdomen.  Follow instructions from your health care provider about eating and drinking and about taking medicines before the procedure. This information is not intended to replace advice given to you by your health care provider. Make sure you discuss any questions you have with your health care provider. Document Revised: 12/19/2018 Document Reviewed: 12/19/2018 Elsevier Patient Education  2021 [employer].

## 2020-06-10 NOTE — Progress Notes (Signed)
Patient Care Team: Deland Pretty, MD as PCP - General (Internal Medicine)   HPI  Kevin Castro is a 75 y.o. male Seen in follow-up for atrial fib.  I saw him 2016 and he sought primary therapy with ablation which was done by [facility] 2017 cx by post procedural SOB  Had syncope assoc with AFib 2017:  Known trifascicular block -In the past pacing and loop recorder had both been recommended and he had declined.  See the note from [facility]-PA abrupt decrease in heart rate (140-close to 0) associated with a syncopal event  No other documented episodes of his apple watch of abrupt changes in heart rate.  History of prostate cancer treated with radiation and hormonal therapy but now has been doing much better and was running a half marathon at the time of his syncopal event.  Heart rate was about 140 as noted.  Exertional neck discomfort. 2021 I spoke with Dr. Eustace Quail.  Suggested evaluation.  CTA was negative  DATE TEST EF   7/21 CTA   % Ca Score 0 RCA 0-25%          Thromboembolic risk factors ( age -42) for a CHADSVASc Score of 1       Past Medical History:  Diagnosis Date  . Atrial tachycardia ([place])   . Paroxysmal atrial fibrillation ([facility])   . Prostate CA ([place])   . Sinus bradycardia   . Trifascicular block     Past Surgical History:  Procedure Laterality Date  . DOBUTAMINE STRESS ECHO    . ELECTROPHYSIOLOGIC STUDY    . ELECTROPHYSIOLOGIC STUDY N/A 10/16/2015   Procedure: Atrial Fibrillation Ablation;  Surgeon: Thompson Grayer, MD;  Location: [place] [facility];  Service: Cardiovascular;  Laterality: N/A;    Current Outpatient Medications  Medication Sig Dispense Refill  . acetaminophen (TYLENOL) 325 MG tablet Take 650 mg by mouth daily as needed (pain). Rarely takes    . CALCIUM-VITAMIN D PO Take by mouth. Per patient doesn't know mg using OTC    . Glucosamine-Chondroit-Vit C-Mn (GLUCOSAMINE 1500 COMPLEX PO) Take by mouth.    . Multiple Vitamin (MULTI-VITAMIN) tablet Take  by mouth.    . Omega-3 Fatty Acids (FISH OIL) 1000 MG CAPS Take by mouth.     No current facility-administered medications for this visit.    Allergies  Allergen Reactions  . Penicillins Rash    Rash/blisters Has patient had a PCN reaction causing immediate rash, facial/tongue/throat swelling, SOB or lightheadedness with hypotension: Yes Has patient had a PCN reaction causing severe rash involving mucus membranes or skin necrosis: No Has patient had a PCN reaction that required hospitalization No Has patient had a PCN reaction occurring within the last 10 years: No If all of the above answers are "NO", then may proceed with Cephalosporin use.       Review of Systems negative except from HPI and PMH  Physical Exam BP 120/74   Pulse 74   Ht 5\' 10"  (1.778 m)   Wt 197 lb 12.8 oz (89.7 kg)   SpO2 98%   BMI 28.38 kg/m  Well developed and nourished in no acute distress HENT normal Neck supple with JVP-  flat   Clear Regular rate and rhythm, no murmurs or gallops Abd-soft with active BS No Clubbing cyanosis edema Skin-warm and dry A & Oriented  Grossly normal sensory and motor function  ECG from 5/22 demonstrated sinus rhythm with trifascicular block with right bundle branch block left axis  deviation -75.  PR interval of 240 ms*   Assessment and  Plan  Trifascicular block  Syncope  Chest pain-pleuritic  Palpitations-- frequent atrial ectopy   Atrial fibrillation no interval that we are aware of   Patient with abrupt onset and offset syncope associated with documentation of an acute loss of pulse in the context of trifascicular block and relatively rapid resolution.  Differential diagnosis would include intermittent complete heart block which is I think more likely and much less likely to implicate the sinus node not previously evidenced as problematic.  I cannot think of a mechanism by which exercise would trigger heart block, especially given lack of coronary disease to  cause ischemic AV block.  Syncope previously was thought to be neurally mediated but it is hard to know and the issue of his conduction system disease has long been concerning to me and others.  I think pacing is indicated.  I do not see an alternative strategy.  We discussed potential risks and benefits including not limited to death perforation infection pocket hematoma lead dislodgment as well as venous occlusion and arm swelling; he understands these risks and is willing to proceed

## 2020-06-11 DIAGNOSIS — R55 Syncope and collapse: Secondary | ICD-10-CM | POA: Diagnosis not present

## 2020-06-11 LAB — CBC
Hematocrit: 37.7 % (ref 37.5–51.0)
Hemoglobin: 13.2 g/dL (ref 13.0–17.7)
MCH: 32.6 pg (ref 26.6–33.0)
MCHC: 35 g/dL (ref 31.5–35.7)
MCV: 93 fL (ref 79–97)
Platelets: 213 10*3/uL (ref 150–450)
RBC: 4.05 x10E6/uL — ABNORMAL LOW (ref 4.14–5.80)
RDW: 12.7 % (ref 11.6–15.4)
WBC: 4.2 10*3/uL (ref 3.4–10.8)

## 2020-06-11 LAB — BASIC METABOLIC PANEL
BUN/Creatinine Ratio: 23 (ref 10–24)
BUN: 21 mg/dL (ref 8–27)
CO2: 27 mmol/L (ref 20–29)
Calcium: 9.9 mg/dL (ref 8.6–10.2)
Chloride: 100 mmol/L (ref 96–106)
Creatinine, Ser: 0.91 mg/dL (ref 0.76–1.27)
Glucose: 102 mg/dL — ABNORMAL HIGH (ref 65–99)
Potassium: 4.4 mmol/L (ref 3.5–5.2)
Sodium: 140 mmol/L (ref 134–144)
eGFR: 90 mL/min/{1.73_m2} (ref >59)

## 2020-06-15 DIAGNOSIS — R55 Syncope and collapse: Secondary | ICD-10-CM | POA: Diagnosis not present

## 2020-06-18 NOTE — Telephone Encounter (Signed)
Successful telephone encounter to patient to follow up on questions regarding pacemaker implantation scheduled for 07/01/20 with Dr. Caryl Comes. Discussed patient questions with Dr. Caryl Comes who encouraged device RN to reinforce previously provided information including pacemaker functions on demand, patient does not have an issue with natural pacemaker of the heart however there is lack of conduction. Reinforced that pacemaker implantation should resolve syncopal episodes related to bradycardia. Patient appreciative of call and education reinforcement.

## 2020-06-22 ENCOUNTER — Ambulatory Visit: Payer: Medicare HMO | Admitting: Student

## 2020-06-24 ENCOUNTER — Other Ambulatory Visit (HOSPITAL_COMMUNITY): Payer: Self-pay

## 2020-06-24 ENCOUNTER — Telehealth: Payer: Self-pay | Admitting: Internal Medicine

## 2020-06-24 ENCOUNTER — Telehealth (INDEPENDENT_AMBULATORY_CARE_PROVIDER_SITE_OTHER): Payer: Medicare HMO | Admitting: Nurse Practitioner

## 2020-06-24 DIAGNOSIS — U071 COVID-19: Secondary | ICD-10-CM

## 2020-06-24 MED ORDER — NIRMATRELVIR/RITONAVIR (PAXLOVID)TABLET
3.0000 | ORAL_TABLET | Freq: Two times a day (BID) | ORAL | 0 refills | Status: AC
  Filled 2020-06-24: qty 30, 5d supply, fill #0

## 2020-06-24 NOTE — Progress Notes (Signed)
Virtual Visit via Telephone Note  I connected with Kevin Castro on 06/24/20 at  2:00 PM EDT by telephone and verified that I am speaking with the correct person using two identifiers.  Location: Patient: home Provider: office   I discussed the limitations, risks, security and privacy concerns of performing an evaluation and management service by telephone and the availability of in person appointments. I also discussed with the patient that there may be a patient responsible charge related to this service. The patient expressed understanding and agreed to proceed.   History of Present Illness:  Patient presents today for post-COVID care clinic visit through televisit.  Patient tested positive at home test this morning.  He states that his symptoms started this past Sunday.  He complains of fatigue, body aches, 100.0 F temperature, slight cough.  Unfortunately patient is scheduled for pacemaker placement this upcoming Wednesday.  We discussed that he wanted to speak with cardiology about rescheduling this procedure.  Patient does qualify for oral antiviral therapy and has had recent labs with normal GFR.  We will prescribe Paxlovid. Denies f/c/s, n/v/d, hemoptysis, PND, chest pain or edema.     Observations/Objective:  Vitals with BMI 06/10/2020 06/02/2020 08/28/2019  Height 5\' 10"  5\' 10"  -  Weight 197 lbs 13 oz 195 lbs 10 oz -  BMI 37.62 83.15 -  Systolic 176 160 737  Diastolic 74 80 56  Pulse 74 73 46      Assessment and Plan:  Covid 19:   Stay well hydrated  Stay active  Deep breathing exercises  May take tylenol or fever or pain  May take mucinex twice daily  Will order Paxlovid  You are being prescribed PAXLOVID for COVID-19 infection.    Please pick up your prescription at: [facility] pharmacy  Address:  [address] [place], [place] [address]         Hours:  Monday - Friday 7:30 am - 6PM                         Saturday 8:00 am - 4:30 pm      Sunday Closed   Phone: [phone]   Please call the pharmacy or go through the drive through vs going inside if you are picking up the mediation yourself to prevent further spread. If prescribed to a [facility] affiliated pharmacy, a pharmacist will bring the medication out to your car.   Medications to hold while taking this treatment: none *If asked to hold, you can resume them 24 hours after your last dose   ADMINISTRATION INSTRUCTIONS: 1. Take with or without food. Swallow the tablets whole. Don't chew, crush, or break the medications because it might not work as well  2. For each dose of the medication, you should be taking 3 tablets together (2 pink oval and 1 white oval) TWICE a day for FIVE days   3. Finish your full five-day course of Paxlovid even if you feel better before you're done. Stopping this medication too early can make it less effective to prevent severe illness related to [place].    4. Paxlovid is prescribed for YOU ONLY. Don't share it with others, even if they have similar symptoms as you. This medication might not be right for everyone.  5. Make sure to take steps to protect yourself and others while you're taking this medication in order to get well soon and to prevent others from getting sick with COVID-19.  6.  Paxlovid (nirmatrelvir / ritonavir) can cause hormonal birth control medications to not work well. If you or your partner is currently taking hormonal birth control, use condoms or other birth control methods to prevent unintended pregnancies.    COMMON SIDE EFFECTS: 1. Altered or bad taste in your mouth  2. Diarrhea  3. High blood pressure (1% of people) 4. Muscle aches (1% of people)     If your COVID-19 symptoms get worse, get medical help right away. Call 911 if you experience symptoms such as worsening cough, trouble breathing, chest pain that doesn't go away, confusion, a hard time staying awake, and pale or blue-colored skin. This medication won't  prevent all COVID-19 cases from getting worse.       Follow up:  Follow up if needed     I discussed the assessment and treatment plan with the patient. The patient was provided an opportunity to ask questions and all were answered. The patient agreed with the plan and demonstrated an understanding of the instructions.   The patient was advised to call back or seek an in-person evaluation if the symptoms worsen or if the condition fails to improve as anticipated.  I provided 23 minutes of non-face-to-face time during this encounter.   Fenton Foy, NP

## 2020-06-24 NOTE — Patient Instructions (Signed)
Covid 19:   Stay well hydrated  Stay active  Deep breathing exercises  May take tylenol or fever or pain  May take mucinex twice daily  Will order Paxlovid  You are being prescribed PAXLOVID for COVID-19 infection.    Please pick up your prescription at: [facility] pharmacy  Address:  [address] [place], [place] [address]         Hours:  Monday - Friday 7:30 am - 6PM                         Saturday 8:00 am - 4:30 pm      Sunday Closed  Phone: [phone]   Please call the pharmacy or go through the drive through vs going inside if you are picking up the mediation yourself to prevent further spread. If prescribed to a [facility] affiliated pharmacy, a pharmacist will bring the medication out to your car.   Medications to hold while taking this treatment: none *If asked to hold, you can resume them 24 hours after your last dose   ADMINISTRATION INSTRUCTIONS: 1. Take with or without food. Swallow the tablets whole. Don't chew, crush, or break the medications because it might not work as well  2. For each dose of the medication, you should be taking 3 tablets together (2 pink oval and 1 white oval) TWICE a day for FIVE days   3. Finish your full five-day course of Paxlovid even if you feel better before you're done. Stopping this medication too early can make it less effective to prevent severe illness related to [place].    4. Paxlovid is prescribed for YOU ONLY. Don't share it with others, even if they have similar symptoms as you. This medication might not be right for everyone.  5. Make sure to take steps to protect yourself and others while you're taking this medication in order to get well soon and to prevent others from getting sick with COVID-19.  6. Paxlovid (nirmatrelvir / ritonavir) can cause hormonal birth control medications to not work well. If you or your partner is currently taking hormonal birth control, use condoms or other birth control methods to  prevent unintended pregnancies.    COMMON SIDE EFFECTS: 1. Altered or bad taste in your mouth  2. Diarrhea  3. High blood pressure (1% of people) 4. Muscle aches (1% of people)     If your COVID-19 symptoms get worse, get medical help right away. Call 911 if you experience symptoms such as worsening cough, trouble breathing, chest pain that doesn't go away, confusion, a hard time staying awake, and pale or blue-colored skin. This medication won't prevent all COVID-19 cases from getting worse.       Follow up:  Follow up if needed

## 2020-06-24 NOTE — Telephone Encounter (Signed)
Pt had not been feeling great and took Covid test yesterday and it came back negative, but this morning it was positive. Pt reports:  "Very very slight cough, sore muscles, slight HA, but feeling better today than yesterday". Aware that Rosann Auerbach, RN is not in the office today but will follow up with him when she returns to come up with a plan for PPM implant that is scheduled for next week.  Pt understands I will not cancel procedure next week.  He understands when Rosann Auerbach calls back they will decide if/when he needs to be rescheduled and then move him to that date.  Patient verbalized understanding and agreeable to plan.

## 2020-06-24 NOTE — Telephone Encounter (Signed)
New message:     Patient need to cancel his CATH he has covid. Please advise

## 2020-06-26 NOTE — Telephone Encounter (Signed)
This has been addressed in pt's MyChart message.  See encounter for complete details.

## 2020-06-30 ENCOUNTER — Other Ambulatory Visit (HOSPITAL_COMMUNITY): Payer: Medicare HMO

## 2020-06-30 DIAGNOSIS — I480 Paroxysmal atrial fibrillation: Secondary | ICD-10-CM

## 2020-06-30 DIAGNOSIS — Z01812 Encounter for preprocedural laboratory examination: Secondary | ICD-10-CM

## 2020-06-30 DIAGNOSIS — I452 Bifascicular block: Secondary | ICD-10-CM

## 2020-07-09 ENCOUNTER — Other Ambulatory Visit: Payer: Self-pay

## 2020-07-09 ENCOUNTER — Other Ambulatory Visit: Payer: Medicare HMO | Admitting: *Deleted

## 2020-07-09 DIAGNOSIS — Z01812 Encounter for preprocedural laboratory examination: Secondary | ICD-10-CM

## 2020-07-09 DIAGNOSIS — I452 Bifascicular block: Secondary | ICD-10-CM | POA: Diagnosis not present

## 2020-07-09 DIAGNOSIS — I480 Paroxysmal atrial fibrillation: Secondary | ICD-10-CM

## 2020-07-10 LAB — BASIC METABOLIC PANEL
BUN/Creatinine Ratio: 19 (ref 10–24)
BUN: 19 mg/dL (ref 8–27)
CO2: 24 mmol/L (ref 20–29)
Calcium: 9.7 mg/dL (ref 8.6–10.2)
Chloride: 100 mmol/L (ref 96–106)
Creatinine, Ser: 1.01 mg/dL (ref 0.76–1.27)
Glucose: 84 mg/dL (ref 65–99)
Potassium: 4.2 mmol/L (ref 3.5–5.2)
Sodium: 138 mmol/L (ref 134–144)
eGFR: 79 mL/min/{1.73_m2} (ref >59)

## 2020-07-10 LAB — CBC
Hematocrit: 35.4 % — ABNORMAL LOW (ref 37.5–51.0)
Hemoglobin: 12.3 g/dL — ABNORMAL LOW (ref 13.0–17.7)
MCH: 32.4 pg (ref 26.6–33.0)
MCHC: 34.7 g/dL (ref 31.5–35.7)
MCV: 93 fL (ref 79–97)
Platelets: 230 10*3/uL (ref 150–450)
RBC: 3.8 x10E6/uL — ABNORMAL LOW (ref 4.14–5.80)
RDW: 12.9 % (ref 11.6–15.4)
WBC: 3.3 10*3/uL — ABNORMAL LOW (ref 3.4–10.8)

## 2020-07-10 NOTE — Pre-Procedure Instructions (Signed)
Instructed patient on the following items: Arrival time 0530 Nothing to eat or drink after midnight No meds AM of procedure Responsible person to drive you home and stay with you for 24 hrs Wash with special soap night before and morning of procedure  

## 2020-07-13 ENCOUNTER — Ambulatory Visit (HOSPITAL_COMMUNITY)
Admission: RE | Disposition: A | Discharge status: Home / Self Care | Payer: Self-pay | Source: Home / Self Care | Attending: Internal Medicine

## 2020-07-13 ENCOUNTER — Ambulatory Visit (HOSPITAL_COMMUNITY)
Admission: RE | Admit: 2020-07-13 | Discharge: 2020-07-13 | Disposition: A | Discharge status: Home / Self Care | Payer: Medicare HMO | Attending: Internal Medicine | Admitting: Internal Medicine

## 2020-07-13 ENCOUNTER — Ambulatory Visit (HOSPITAL_COMMUNITY): Payer: Medicare HMO

## 2020-07-13 ENCOUNTER — Encounter (HOSPITAL_COMMUNITY): Payer: Self-pay | Admitting: Internal Medicine

## 2020-07-13 ENCOUNTER — Other Ambulatory Visit: Payer: Self-pay

## 2020-07-13 DIAGNOSIS — I480 Paroxysmal atrial fibrillation: Secondary | ICD-10-CM

## 2020-07-13 DIAGNOSIS — Z79899 Other long term (current) drug therapy: Secondary | ICD-10-CM | POA: Diagnosis not present

## 2020-07-13 DIAGNOSIS — Z8249 Family history of ischemic heart disease and other diseases of the circulatory system: Secondary | ICD-10-CM | POA: Insufficient documentation

## 2020-07-13 DIAGNOSIS — Z959 Presence of cardiac and vascular implant and graft, unspecified: Secondary | ICD-10-CM

## 2020-07-13 DIAGNOSIS — Z880 Allergy status to penicillin: Secondary | ICD-10-CM | POA: Diagnosis not present

## 2020-07-13 DIAGNOSIS — Z8546 Personal history of malignant neoplasm of prostate: Secondary | ICD-10-CM | POA: Diagnosis not present

## 2020-07-13 DIAGNOSIS — I453 Trifascicular block: Secondary | ICD-10-CM | POA: Diagnosis not present

## 2020-07-13 DIAGNOSIS — Z87891 Personal history of nicotine dependence: Secondary | ICD-10-CM | POA: Diagnosis not present

## 2020-07-13 DIAGNOSIS — R55 Syncope and collapse: Secondary | ICD-10-CM | POA: Insufficient documentation

## 2020-07-13 DIAGNOSIS — Z950 Presence of cardiac pacemaker: Secondary | ICD-10-CM | POA: Diagnosis not present

## 2020-07-13 HISTORY — PX: PACEMAKER IMPLANT: EP1218

## 2020-07-13 SURGERY — PACEMAKER IMPLANT

## 2020-07-13 MED ORDER — VANCOMYCIN HCL IN DEXTROSE 1-5 GM/200ML-% IV SOLN
1000.0000 mg | INTRAVENOUS | Status: AC
  Administered 2020-07-13: 1000 mg via INTRAVENOUS

## 2020-07-13 MED ORDER — LIDOCAINE HCL (PF) 1 % IJ SOLN
INTRAMUSCULAR | Status: DC | PRN
  Administered 2020-07-13: 50 mL

## 2020-07-13 MED ORDER — SODIUM CHLORIDE 0.9 % IV SOLN
80.0000 mg | INTRAVENOUS | Status: AC
  Administered 2020-07-13: 80 mg
  Filled 2020-07-13: qty 2

## 2020-07-13 MED ORDER — FENTANYL CITRATE (PF) 100 MCG/2ML IJ SOLN
INTRAMUSCULAR | Status: DC | PRN
  Administered 2020-07-13 (×4): 25 ug via INTRAVENOUS

## 2020-07-13 MED ORDER — YOU HAVE A PACEMAKER BOOK
Status: AC
  Filled 2020-07-13: qty 1

## 2020-07-13 MED ORDER — LIDOCAINE HCL 1 % IJ SOLN
INTRAMUSCULAR | Status: AC
  Filled 2020-07-13: qty 60

## 2020-07-13 MED ORDER — VANCOMYCIN HCL IN DEXTROSE 1-5 GM/200ML-% IV SOLN
INTRAVENOUS | Status: AC
  Filled 2020-07-13: qty 200

## 2020-07-13 MED ORDER — FENTANYL CITRATE (PF) 100 MCG/2ML IJ SOLN
INTRAMUSCULAR | Status: AC
  Filled 2020-07-13: qty 2

## 2020-07-13 MED ORDER — MIDAZOLAM HCL 5 MG/5ML IJ SOLN
INTRAMUSCULAR | Status: AC
  Filled 2020-07-13: qty 5

## 2020-07-13 MED ORDER — MIDAZOLAM HCL 5 MG/5ML IJ SOLN
INTRAMUSCULAR | Status: DC | PRN
  Administered 2020-07-13: 2 mg via INTRAVENOUS
  Administered 2020-07-13 (×4): 1 mg via INTRAVENOUS

## 2020-07-13 MED ORDER — SODIUM CHLORIDE 0.9 % IV SOLN
INTRAVENOUS | Status: AC
  Filled 2020-07-13: qty 2

## 2020-07-13 MED ORDER — SODIUM CHLORIDE 0.9 % IV SOLN: INTRAVENOUS | Status: DC

## 2020-07-13 MED ORDER — ONDANSETRON HCL 4 MG/2ML IJ SOLN: 4.0000 mg | Freq: Four times a day (QID) | INTRAMUSCULAR | Status: DC | PRN

## 2020-07-13 MED ORDER — HEPARIN (PORCINE) IN NACL 1000-0.9 UT/500ML-% IV SOLN
INTRAVENOUS | Status: AC
  Filled 2020-07-13: qty 500

## 2020-07-13 MED ORDER — ACETAMINOPHEN 325 MG PO TABS
325.0000 mg | ORAL_TABLET | ORAL | Status: DC | PRN
Start: 2020-07-13 — End: 2020-07-13

## 2020-07-13 MED ORDER — SODIUM CHLORIDE 0.9 % IV SOLN
INTRAVENOUS | Status: DC
Start: 2020-07-13 — End: 2020-07-13

## 2020-07-13 MED ORDER — HEPARIN (PORCINE) IN NACL 1000-0.9 UT/500ML-% IV SOLN
INTRAVENOUS | Status: DC | PRN
  Administered 2020-07-13: 500 mL

## 2020-07-13 SURGICAL SUPPLY — 8 items
CABLE SURGICAL S-101-97-12 (CABLE) ×2 IMPLANT
LEAD INGEVITY 7841 52 (Lead) ×1 IMPLANT
LEAD INGEVITY 7842 59 (Lead) ×1 IMPLANT
MAT PREVALON FULL STRYKER (MISCELLANEOUS) ×1 IMPLANT
PACEMAKER ACCOLADE DR-EL (Pacemaker) ×1 IMPLANT
PAD PRO RADIOLUCENT 2001M-C (PAD) ×2 IMPLANT
SHEATH 7FR PRELUDE SNAP 13 (SHEATH) ×2 IMPLANT
TRAY PACEMAKER INSERTION (PACKS) ×2 IMPLANT

## 2020-07-13 NOTE — Discharge Instructions (Signed)
    Supplemental Discharge Instructions for  Pacemaker/Defibrillator Patients  Tomorrow, 07/14/20, send in a device transmission  Activity No heavy lifting or vigorous activity with your left/right arm for 6 to 8 weeks.  Do not raise your left/right arm above your head for one week.  Gradually raise your affected arm as drawn below.              07/21/20                    07/22/20                   07/23/20                 07/24/20 __  NO DRIVING for  1 week   ; you may begin driving on   0/35/00  .  WOUND CARE Keep the wound area clean and dry.  Do not get this area wet , no showers for 24 hours; you may shower on   07/14/20 evening  . Tomorrow, 07/14/20, remove the arm sling  Dr. Caryl Comes used DERMABOND to close your wound.  DO NOT peel this off.  Do not rub/scrub the area, pat dry.  No bandage is needed on the site.  DO  NOT apply any creams, oils, or ointments to the wound area. If you notice any drainage or discharge from the wound, any swelling or bruising at the site, or you develop a fever > 101? F after you are discharged home, call the office at once.  Special Instructions You are still able to use cellular telephones; use the ear opposite the side where you have your pacemaker/defibrillator.  Avoid carrying your cellular phone near your device. When traveling through airports, show security personnel your identification card to avoid being screened in the metal detectors.  Ask the security personnel to use the hand wand. Avoid arc welding equipment, MRI testing (magnetic resonance imaging), TENS units (transcutaneous nerve stimulators).  Call the office for questions about other devices. Avoid electrical appliances that are in poor condition or are not properly grounded. Microwave ovens are safe to be near or to operate.

## 2020-07-13 NOTE — H&P (Signed)
Patient Care Team: Deland Pretty, MD as PCP - General (Internal Medicine)   HPI  Kevin Castro is a 75 y.o. male admitted for implantation of pacemaker for syncope, trifascicular block and syncope with smart watch technology demonstrating a coincidental loss of pulse.    Hx of syncope assoc with afib, pacing and loop recommended in the past but decli9ne  Interval prostate CA Rx with hormonal and radiation therapy, now completed and interval COVID from which he has recovered  DATE TEST EF    7/21 CTA   % Ca Score 0 RCA 0-25%            Thromboembolic risk factors ( age -41) for a CHADSVASc Score of 1     Records and Results Reviewed  Past Medical History:  Diagnosis Date   Atrial tachycardia (HCC)    Paroxysmal atrial fibrillation (HCC)    Prostate CA ([place])    Sinus bradycardia    Trifascicular block     Past Surgical History:  Procedure Laterality Date   DOBUTAMINE STRESS ECHO     ELECTROPHYSIOLOGIC STUDY     ELECTROPHYSIOLOGIC STUDY N/A 10/16/2015   Procedure: Atrial Fibrillation Ablation;  Surgeon: Thompson Grayer, MD;  Location: [place] [facility];  Service: Cardiovascular;  Laterality: N/A;    Current Facility-Administered Medications  Medication Dose Route Frequency Provider Last Rate Last Admin   0.9 %  sodium chloride infusion   Intravenous Continuous Deboraha Sprang, MD 50 mL/hr at 07/13/20 0635 New Bag at 07/13/20 0635   0.9 %  sodium chloride infusion   Intravenous Continuous Deboraha Sprang, MD 50 mL/hr at 07/13/20 0630 New Bag at 07/13/20 0630   gentamicin (GARAMYCIN) 80 mg in sodium chloride 0.9 % 500 mL irrigation  80 mg Irrigation On Call Deboraha Sprang, MD       vancomycin (VANCOCIN) IVPB 1000 mg/200 mL premix  1,000 mg Intravenous On Call Deboraha Sprang, MD        Allergies  Allergen Reactions   Penicillins Rash    Rash/blisters Has patient had a PCN reaction causing immediate rash, facial/tongue/throat swelling, SOB or lightheadedness with  hypotension: Yes Has patient had a PCN reaction causing severe rash involving mucus membranes or skin necrosis: No Has patient had a PCN reaction that required hospitalization No Has patient had a PCN reaction occurring within the last 10 years: No If all of the above answers are "NO", then may proceed with Cephalosporin use.       Social History   Tobacco Use   Smoking status: Former    Packs/day: 0.75    Years: 7.00    Pack years: 5.25    Types: Cigarettes    Quit date: 01/31/1973    Years since quitting: 47.4   Smokeless tobacco: Never  Vaping Use   Vaping Use: Never used  Substance Use Topics   Alcohol use: Yes    Alcohol/week: 12.0 standard drinks    Types: 12 Cans of beer per week    Comment: "can go through a couple six packs on the weekend"   Drug use: No     Family History  Problem Relation Age of Onset   Arrhythmia Mother    Stroke Mother        48   Lung cancer Father    Liver disease Father    Arrhythmia Sister    Arrhythmia Brother    Diabetes Brother        oldest  brother   Breast cancer Cousin    Heart attack Neg Hx    Hypertension Neg Hx    Prostate cancer Neg Hx    Pancreatic cancer Neg Hx    Colon cancer Neg Hx      Current Meds  Medication Sig   acetaminophen (TYLENOL) 325 MG tablet Take 650 mg by mouth daily as needed (pain). Rarely takes   CALCIUM-VITAMIN D PO Take 600 mg elemental calcium/kg/hr by mouth 2 (two) times daily.   Glucosamine-Chondroit-Vit C-Mn (GLUCOSAMINE 1500 COMPLEX PO) Take 1,500 mg by mouth 2 (two) times daily. 1500 mg / 1200 mg   Multiple Vitamin (MULTI-VITAMIN) tablet Take 1 tablet by mouth daily.   naproxen sodium (ALEVE) 220 MG tablet Take 220-440 mg by mouth daily as needed (pain).   Omega-3 Fatty Acids (FISH OIL) 1000 MG CAPS Take 1,000 mg by mouth 2 (two) times daily.     Review of Systems negative except from HPI and PMH  Physical Exam BP 129/76   Pulse 67   Temp 98.5 F (36.9 C) (Oral)   Ht 5\' 10"  (1.778  m)   Wt 86.2 kg   SpO2 99%   BMI 27.26 kg/m  Well developed and well nourished in no acute distress HENT normal E scleral and icterus clear Neck Supple JVP flat; carotids brisk and full Clear to ausculation Regular rate and rhythm, no murmurs gallops or rub Soft with active bowel sounds No clubbing cyanosis  Edema Alert and oriented, grossly normal motor and sensory function Skin Warm and Dry    Assessment and  Plan  Trifascicular block   Syncope with demonstrated    Chest pain-pleuritic   Palpitations-- frequent atrial ectopy   Atrial fibrillation     The benefits and risks were reviewed including but not limited to death,  perforation, infection, lead dislodgement and device malfunction.  The patient understands agrees and is willing to proceed. Pt is left handed and will proceed from that side after discussions

## 2020-07-13 NOTE — Interval H&P Note (Signed)
History and Physical Interval Note:  07/13/2020 7:54 AM  Kevin Castro  has presented today for surgery, with the diagnosis of blockage.  The various methods of treatment have been discussed with the patient and family. After consideration of risks, benefits and other options for treatment, the patient has consented to  Procedure(s): PACEMAKER IMPLANT (N/A) as a surgical intervention.  The patient's history has been reviewed, patient examined, no change in status, stable for surgery.  I have reviewed the patient's chart and labs.  Questions were answered to the patient's satisfaction.     Virl Axe

## 2020-07-14 ENCOUNTER — Ambulatory Visit: Payer: Medicare HMO

## 2020-07-14 ENCOUNTER — Telehealth: Payer: Self-pay

## 2020-07-14 MED FILL — Lidocaine HCl Local Inj 1%: INTRAMUSCULAR | Qty: 60 | Status: AC

## 2020-07-14 NOTE — Telephone Encounter (Signed)
-----   Message from [place], [place] sent at 07/13/2020  4:01 PM EDT ----- Same day d/c   Bsci PPM SK

## 2020-07-14 NOTE — Telephone Encounter (Signed)
Follow-up after same day discharge: Implant date: 07/13/2020  MD: Virl Axe, MD Device: [place] [id number] [facility]ade MRI EL  Location: Left Chest Wound check visit: 07/23/2020  @ 10:05 90 day MD follow-up: 10/28/20 @ 3:45 Remote Transmission received: Yes Dressing removed: No, dermabond in place. Denies any redness, swelling, drainage. Sling Removed: Yes

## 2020-07-23 ENCOUNTER — Other Ambulatory Visit: Payer: Self-pay

## 2020-07-23 ENCOUNTER — Ambulatory Visit: Payer: Medicare HMO | Admitting: Student

## 2020-07-23 DIAGNOSIS — R55 Syncope and collapse: Secondary | ICD-10-CM | POA: Diagnosis not present

## 2020-07-23 DIAGNOSIS — I452 Bifascicular block: Secondary | ICD-10-CM

## 2020-07-23 LAB — CUP PACEART INCLINIC DEVICE CHECK
Date Time Interrogation Session: 20220623112703
Implantable Lead Implant Date: 20220613
Implantable Lead Implant Date: 20220613
Implantable Lead Location: 753859
Implantable Lead Location: 753860
Implantable Lead Model: 7841
Implantable Lead Model: 7842
Implantable Lead Serial Number: 1074164
Implantable Lead Serial Number: 1156308
Implantable Pulse Generator Implant Date: 20220613
Lead Channel Impedance Value: 567 Ohm
Lead Channel Impedance Value: 660 Ohm
Lead Channel Pacing Threshold Amplitude: 0.7 V
Lead Channel Pacing Threshold Amplitude: 0.8 V
Lead Channel Pacing Threshold Pulse Width: 0.4 ms
Lead Channel Pacing Threshold Pulse Width: 0.4 ms
Lead Channel Sensing Intrinsic Amplitude: 13.3 mV
Lead Channel Sensing Intrinsic Amplitude: 3.5 mV
Lead Channel Setting Pacing Amplitude: 3.5 V
Lead Channel Setting Pacing Amplitude: 3.5 V
Lead Channel Setting Pacing Pulse Width: 0.4 ms
Lead Channel Setting Sensing Sensitivity: 2.5 mV
Pulse Gen Serial Number: 994472

## 2020-07-23 NOTE — Progress Notes (Signed)
Wound check appointment. Steri-strips removed. Wound without redness or edema. Incision edges approximated, wound well healed. Normal device function. Thresholds, sensing, and impedances consistent with implant measurements. Device programmed at 3.5V/auto capture programmed on for extra safety margin until 3 month visit. Histogram distribution appropriate for patient and level of activity. 1 episode of NSVT appears 1:1 A-V, more likely AT/SVT. Patient educated about wound care, arm mobility, lifting restrictions. ROV in 3 months with Dr. Caryl Comes

## 2020-07-23 NOTE — Patient Instructions (Signed)
Medication Instructions:  Your physician recommends that you continue on your current medications as directed. Please refer to the Current Medication list given to you today.  *If you need a refill on your cardiac medications before your next appointment, please call your pharmacy*   Lab Work: none If you have labs (blood work) drawn today and your tests are completely normal, you will receive your results only by: [place] (if you have MyChart) OR A paper copy in the mail If you have any lab test that is abnormal or we need to change your treatment, we will call you to review the results.    Follow-Up: At [facility], you and your health needs are our priority.  As part of our continuing mission to provide you with exceptional heart care, we have created designated Provider Care Teams.  These Care Teams include your primary Cardiologist (physician) and Advanced Practice Providers (APPs -  Physician Assistants and Nurse Practitioners) who all work together to provide you with the care you need, when you need it.  We recommend signing up for the patient portal called "MyChart".  Sign up information is provided on this After Visit Summary.  MyChart is used to connect with patients for Virtual Visits (Telemedicine).  Patients are able to view lab/test results, encounter notes, upcoming appointments, etc.  Non-urgent messages can be sent to your provider as well.   To learn more about what you can do with MyChart, go to [email or web address].    Your next appointment:   As scheduled

## 2020-07-24 DIAGNOSIS — D649 Anemia, unspecified: Secondary | ICD-10-CM | POA: Diagnosis not present

## 2020-07-24 DIAGNOSIS — D72819 Decreased white blood cell count, unspecified: Secondary | ICD-10-CM | POA: Diagnosis not present

## 2020-07-24 DIAGNOSIS — R5383 Other fatigue: Secondary | ICD-10-CM | POA: Diagnosis not present

## 2020-07-24 DIAGNOSIS — N402 Nodular prostate without lower urinary tract symptoms: Secondary | ICD-10-CM | POA: Diagnosis not present

## 2020-07-29 DIAGNOSIS — Z0001 Encounter for general adult medical examination with abnormal findings: Secondary | ICD-10-CM | POA: Diagnosis not present

## 2020-07-29 DIAGNOSIS — E7800 Pure hypercholesterolemia, unspecified: Secondary | ICD-10-CM | POA: Diagnosis not present

## 2020-07-29 DIAGNOSIS — I453 Trifascicular block: Secondary | ICD-10-CM | POA: Diagnosis not present

## 2020-07-29 DIAGNOSIS — Z95810 Presence of automatic (implantable) cardiac defibrillator: Secondary | ICD-10-CM | POA: Diagnosis not present

## 2020-07-29 DIAGNOSIS — D649 Anemia, unspecified: Secondary | ICD-10-CM | POA: Diagnosis not present

## 2020-07-29 DIAGNOSIS — S98121S Partial traumatic amputation of right great toe, sequela: Secondary | ICD-10-CM | POA: Diagnosis not present

## 2020-07-30 ENCOUNTER — Other Ambulatory Visit: Payer: Self-pay | Admitting: Internal Medicine

## 2020-07-30 DIAGNOSIS — E7800 Pure hypercholesterolemia, unspecified: Secondary | ICD-10-CM

## 2020-08-07 ENCOUNTER — Telehealth: Payer: Self-pay | Admitting: Nurse Practitioner

## 2020-08-07 NOTE — Telephone Encounter (Signed)
Received a new hem referral from Dr. Shelia Media for anemia. Mr. Lakey has been cld and scheduled to see Lattie Haw on 8/10 at 11:15am w/labs on 8/8 at 9:15am.

## 2020-08-17 DIAGNOSIS — D649 Anemia, unspecified: Secondary | ICD-10-CM | POA: Diagnosis not present

## 2020-08-18 DIAGNOSIS — H40023 Open angle with borderline findings, high risk, bilateral: Secondary | ICD-10-CM | POA: Diagnosis not present

## 2020-08-28 DIAGNOSIS — C61 Malignant neoplasm of prostate: Secondary | ICD-10-CM | POA: Diagnosis not present

## 2020-08-31 ENCOUNTER — Ambulatory Visit
Admission: RE | Admit: 2020-08-31 | Discharge: 2020-08-31 | Disposition: A | Discharge status: Home / Self Care | Payer: Medicare HMO | Source: Ambulatory Visit | Attending: Internal Medicine | Admitting: Internal Medicine

## 2020-08-31 ENCOUNTER — Other Ambulatory Visit: Payer: Self-pay

## 2020-08-31 DIAGNOSIS — E7800 Pure hypercholesterolemia, unspecified: Secondary | ICD-10-CM

## 2020-09-04 DIAGNOSIS — Z8546 Personal history of malignant neoplasm of prostate: Secondary | ICD-10-CM | POA: Diagnosis not present

## 2020-09-04 DIAGNOSIS — R351 Nocturia: Secondary | ICD-10-CM | POA: Diagnosis not present

## 2020-09-04 DIAGNOSIS — N401 Enlarged prostate with lower urinary tract symptoms: Secondary | ICD-10-CM | POA: Diagnosis not present

## 2020-09-07 ENCOUNTER — Other Ambulatory Visit: Payer: Self-pay | Admitting: *Deleted

## 2020-09-07 ENCOUNTER — Inpatient Hospital Stay: Payer: Medicare HMO | Attending: Oncology

## 2020-09-07 ENCOUNTER — Other Ambulatory Visit: Payer: Self-pay

## 2020-09-07 DIAGNOSIS — Z923 Personal history of irradiation: Secondary | ICD-10-CM | POA: Insufficient documentation

## 2020-09-07 DIAGNOSIS — Z8546 Personal history of malignant neoplasm of prostate: Secondary | ICD-10-CM | POA: Diagnosis not present

## 2020-09-07 DIAGNOSIS — I4891 Unspecified atrial fibrillation: Secondary | ICD-10-CM | POA: Diagnosis not present

## 2020-09-07 DIAGNOSIS — D649 Anemia, unspecified: Secondary | ICD-10-CM

## 2020-09-07 DIAGNOSIS — C61 Malignant neoplasm of prostate: Secondary | ICD-10-CM

## 2020-09-07 DIAGNOSIS — D72819 Decreased white blood cell count, unspecified: Secondary | ICD-10-CM | POA: Insufficient documentation

## 2020-09-07 DIAGNOSIS — Z950 Presence of cardiac pacemaker: Secondary | ICD-10-CM | POA: Diagnosis not present

## 2020-09-07 LAB — CMP (CANCER CENTER ONLY)
ALT: 18 U/L (ref 0–44)
AST: 27 U/L (ref 15–41)
Albumin: 4.1 g/dL (ref 3.5–5.0)
Alkaline Phosphatase: 49 U/L (ref 38–126)
Anion gap: 5 (ref 5–15)
BUN: 19 mg/dL (ref 8–23)
CO2: 30 mmol/L (ref 22–32)
Calcium: 10 mg/dL (ref 8.9–10.3)
Chloride: 104 mmol/L (ref 98–111)
Creatinine: 1.04 mg/dL (ref 0.61–1.24)
GFR, Estimated: >60 mL/min (ref >60)
Glucose, Bld: 97 mg/dL (ref 70–99)
Potassium: 4.7 mmol/L (ref 3.5–5.1)
Sodium: 139 mmol/L (ref 135–145)
Total Bilirubin: 0.9 mg/dL (ref 0.3–1.2)
Total Protein: 6.9 g/dL (ref 6.5–8.1)

## 2020-09-07 LAB — CBC WITH DIFFERENTIAL (CANCER CENTER ONLY)
Abs Immature Granulocytes: 0 10*3/uL (ref 0.00–0.07)
Basophils Absolute: 0 10*3/uL (ref 0.0–0.1)
Basophils Relative: 1 %
Eosinophils Absolute: 0.2 10*3/uL (ref 0.0–0.5)
Eosinophils Relative: 6 %
HCT: 35.8 % — ABNORMAL LOW (ref 39.0–52.0)
Hemoglobin: 12.4 g/dL — ABNORMAL LOW (ref 13.0–17.0)
Immature Granulocytes: 0 %
Lymphocytes Relative: 24 %
Lymphs Abs: 0.7 10*3/uL (ref 0.7–4.0)
MCH: 32.1 pg (ref 26.0–34.0)
MCHC: 34.6 g/dL (ref 30.0–36.0)
MCV: 92.7 fL (ref 80.0–100.0)
Monocytes Absolute: 0.4 10*3/uL (ref 0.1–1.0)
Monocytes Relative: 13 %
Neutro Abs: 1.5 10*3/uL — ABNORMAL LOW (ref 1.7–7.7)
Neutrophils Relative %: 56 %
Platelet Count: 169 10*3/uL (ref 150–400)
RBC: 3.86 MIL/uL — ABNORMAL LOW (ref 4.22–5.81)
RDW: 12.8 % (ref 11.5–15.5)
WBC Count: 2.8 10*3/uL — ABNORMAL LOW (ref 4.0–10.5)
nRBC: 0 % (ref 0.0–0.2)

## 2020-09-07 LAB — SAVE SMEAR(SSMR), FOR PROVIDER SLIDE REVIEW

## 2020-09-07 NOTE — Progress Notes (Signed)
Added smear to labs today. Lab notified.

## 2020-09-08 ENCOUNTER — Telehealth: Payer: Self-pay | Admitting: *Deleted

## 2020-09-08 NOTE — Telephone Encounter (Signed)
Called patient and confirmed appointment for 1115 on 09/09/20 w/arrival 15 minutes early. He is aware of office location and parking. Informed him of visitor policy and mask requirements and what will occur at visit.

## 2020-09-08 NOTE — Progress Notes (Signed)
New Hematology/Oncology Consult   Requesting MD: Dr. Deland Pretty  [phone]      Reason for Consult: Anemia  HPI: Kevin Castro is a 75 year old man referred for further evaluation of anemia.  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  Labs from 07/24/2020 showed hemoglobin of 12.6, MCV 92.9, white count 3.3, platelet count 173,000.  Labs from 06/01/2020 show a hemoglobin of 12.8, MCV 98.3, white count 4.1 and platelets 204,000.  This compares to labs from 06/24/2016 hemoglobin 13.2 and 04/02/2012 at which time the hemoglobin was 14.1.  He was diagnosed with prostate cancer late 2020/early 2021.  He completed radiation February to April 2021 and a course of Eligard, last injection given December 2021.  CBC from 01/11/2019 showed a hemoglobin of 14.1, normal white count and normal platelet count.    Past Medical History:  Diagnosis Date   Atrial tachycardia (HCC)    Paroxysmal atrial fibrillation (HCC)    Prostate CA (HCC)    Sinus bradycardia    Trifascicular block   :   Past Surgical History:  Procedure Laterality Date   DOBUTAMINE STRESS ECHO     ELECTROPHYSIOLOGIC STUDY     ELECTROPHYSIOLOGIC STUDY N/A 10/16/2015   Procedure: Atrial Fibrillation Ablation;  Surgeon: Thompson Grayer, MD;  Location: [facility];  Service: Cardiovascular;  Laterality: N/A;   PACEMAKER IMPLANT N/A 07/13/2020   Procedure: PACEMAKER IMPLANT;  Surgeon: Deboraha Sprang, MD;  Location: [facility];  Service: Cardiovascular;  Laterality: N/A;  :   Current Outpatient Medications:    acetaminophen (TYLENOL) 325 MG tablet, Take 650 mg by mouth daily as needed (pain). Rarely takes, Disp: , Rfl:    CALCIUM-VITAMIN D PO, Take 600 mg elemental calcium/kg/hr by mouth 2 (two) times daily., Disp: , Rfl:     Glucosamine-Chondroit-Vit C-Mn (GLUCOSAMINE 1500 COMPLEX PO), Take 1,500 mg by mouth 2 (two) times daily. 1500 mg / 1200 mg, Disp: , Rfl:    Multiple Vitamin (MULTI-VITAMIN) tablet, Take 1 tablet by mouth daily., Disp: , Rfl:    naproxen sodium (ALEVE) 220 MG tablet, Take 220-440 mg by mouth daily as needed (pain)., Disp: , Rfl:    Omega-3 Fatty Acids (FISH OIL) 1000 MG CAPS, Take 1,000 mg by mouth 2 (two) times daily., Disp: , Rfl: :  :   Allergies  Allergen Reactions   Penicillins Rash    Rash/blisters Has patient had a PCN reaction causing immediate rash, facial/tongue/throat swelling, SOB or lightheadedness with hypotension: Yes Has patient had a PCN reaction causing severe rash involving mucus membranes or skin necrosis: No Has patient had a PCN reaction that required hospitalization No Has patient had a PCN reaction occurring within the last 10 years: No If all of the above answers are "NO", then may proceed with Cephalosporin use.   :  FH: No family history of a blood disorder.  Mother died at age 10.  Father died age 69.  SOCIAL HISTORY: He lives in [place].  He is married.  He has 2 healthy children.  He is a retired Art gallery manager.  No tobacco use.  EtOH intake described as "very little".  He has never had a blood transfusion.  Review of Systems: He feels in general his health is "pretty good".  No fevers or sweats.  No anorexia.  He eats a normal diet.  He  reports intentional weight loss of 10 to 11 pounds over the past 4 weeks.  He exercises on a regular basis.  He has periodic neck pain.  He is not aware of bleeding except possibly hemorrhoidal at times.  He thinks he is due for colonoscopy next year.  He reports completing stool cards about 3 weeks ago, reported negative for blood.  He denies shortness of breath.  Some fatigue.  No nausea or vomiting.  No dysphagia.  No change in bowel habits.  No bloody or black stools.  Physical Exam:  Blood pressure 120/81, pulse  61, temperature 98.2 F (36.8 C), temperature source Oral, resp. rate 18, height _0  (1.778 m), weight 186 lb 12.8 oz (84.7 kg), SpO2 100 %.  HEENT: Sclera anicteric.  Oropharynx without thrush or ulcers. Lungs: Lungs clear bilaterally. Cardiac: Regular rate and rhythm. Abdomen: Abdomen soft and nontender.  No hepatosplenomegaly. Vascular: No leg edema. Lymph nodes: No palpable cervical, supraclavicular, axillary or inguinal lymph nodes. Neurologic: Alert and oriented. Skin: No rash.  LABS:   Recent Labs    09/07/20 0926  WBC 2.8*  HGB 12.4*  HCT 35.8*  PLT 169     Recent Labs    09/07/20 0926  NA 139  K 4.7  CL 104  CO2 30  GLUCOSE 97  BUN 19  CREATININE 1.04  CALCIUM 10.0   Peripheral blood smear: #1 WBCs-decreased in number, morphology unremarkable; #2 platelets-normal in number; #3 RBCs-polychromasia is not increased, few ovalocytes and macrocytes, rare teardrop   RADIOLOGY:  CT CARDIAC SCORING ([facility] LOCATIONS ONLY)  Result Date: 08/31/2020 CLINICAL DATA:  75 year old Caucasian male with history of hypercholesterolemia and prior smoking history. EXAM: CT CARDIAC CORONARY ARTERY CALCIUM SCORE TECHNIQUE: Non-contrast imaging through the heart was performed using prospective ECG gating. Image post processing was performed on an independent workstation, allowing for quantitative analysis of the heart and coronary arteries. Note that this exam targets the heart and the chest was not imaged in its entirety. COMPARISON:  Prior calcium score study with coronary CTA on 08/28/2019 FINDINGS: CORONARY CALCIUM SCORES: Left Main: 0 LAD: 10 LCx: 0 RCA: 0 Total Agatston Score: 10 MESA database percentile: 15 AORTA MEASUREMENTS: Ascending Aorta: 38 mm Descending Aorta: 27 mm OTHER FINDINGS: The heart size is within normal limits. Interval placement dual-chamber pacemaker with appropriate positioning of right atrial appendage and right ventricular leads. No pericardial fluid is  identified. Visualized segments of the thoracic aorta and central pulmonary arteries are normal in caliber. Visualized mediastinum and hilar regions demonstrate no lymphadenopathy or masses. Visualized lungs show no evidence of pulmonary edema, consolidation, pneumothorax, nodule or pleural fluid. Visualized upper abdomen and bony structures are unremarkable. IMPRESSION: Coronary calcium score of 10 is at the 15th percentile for the patient's age, sex and race. Electronically Signed   By: Aletta Edouard M.D.   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  He has had a mild anemia intermittently for many years.  The anemia seems to have progressed following treatment for prostate cancer.  In addition, the total white count is low on labs we performed earlier this week.  He will return to the lab today for iron studies, B12, myeloma panel.  Pending those results our plan is for him to return in 3 months for a follow-up CBC and office visit.  We are available to see him sooner if needed.  We reviewed signs/symptoms suggestive of progressive anemia.  Patient seen with Dr. Benay Spice.    Ned Card, NP 09/09/2020, 1:05 PM  This was a shared visit with Ned Card.  Kevin Castro was interviewed and examined.  I reviewed the peripheral blood smear and laboratory studies.  He has mild normocytic anemia.  There is no obvious explanation for the mild anemia.  The anemia may be related to treatment for prostate cancer and androgen deprivation.   However he had mild anemia prior to the prostate cancer treatment.  He also has mild leukopenia today.  The differential diagnosis includes myelodysplasia and a hematopoietic malignancy.  We obtained additional laboratory evaluation today.  He will return for an office visit and repeat CBC in 3 months.  We will recommend a diagnostic bone marrow biopsy if he develops progressive hematologic abnormalities.  I was present for greater than 50% of today's visit.  I performed medical decision making.  Julieanne Manson, MD

## 2020-09-09 ENCOUNTER — Other Ambulatory Visit: Payer: Self-pay

## 2020-09-09 ENCOUNTER — Inpatient Hospital Stay: Payer: Medicare HMO

## 2020-09-09 ENCOUNTER — Inpatient Hospital Stay: Payer: Medicare HMO | Admitting: Nurse Practitioner

## 2020-09-09 VITALS — BP 120/81 | HR 61 | Temp 98.2°F | Resp 18 | Ht 70.0 in | Wt 186.8 lb

## 2020-09-09 DIAGNOSIS — D649 Anemia, unspecified: Secondary | ICD-10-CM

## 2020-09-09 DIAGNOSIS — I4891 Unspecified atrial fibrillation: Secondary | ICD-10-CM | POA: Diagnosis not present

## 2020-09-09 DIAGNOSIS — Z950 Presence of cardiac pacemaker: Secondary | ICD-10-CM | POA: Diagnosis not present

## 2020-09-09 DIAGNOSIS — Z923 Personal history of irradiation: Secondary | ICD-10-CM | POA: Diagnosis not present

## 2020-09-09 DIAGNOSIS — Z8546 Personal history of malignant neoplasm of prostate: Secondary | ICD-10-CM | POA: Diagnosis not present

## 2020-09-09 DIAGNOSIS — D72819 Decreased white blood cell count, unspecified: Secondary | ICD-10-CM | POA: Diagnosis not present

## 2020-09-09 LAB — IRON AND TIBC
Iron: 105 ug/dL (ref 45–182)
Saturation Ratios: 29 % (ref 17.9–39.5)
TIBC: 360 ug/dL (ref 250–450)
UIBC: 255 ug/dL

## 2020-09-09 LAB — VITAMIN B12: Vitamin B-12: 539 pg/mL (ref 180–914)

## 2020-09-09 LAB — FERRITIN: Ferritin: 102 ng/mL (ref 24–336)

## 2020-09-10 LAB — KAPPA/LAMBDA LIGHT CHAINS
Kappa free light chain: 20.4 mg/L — ABNORMAL HIGH (ref 3.3–19.4)
Kappa, lambda light chain ratio: 1.16 (ref 0.26–1.65)
Lambda free light chains: 17.6 mg/L (ref 5.7–26.3)

## 2020-09-14 LAB — MULTIPLE MYELOMA PANEL, SERUM
Albumin SerPl Elph-Mcnc: 4.1 g/dL (ref 2.9–4.4)
Albumin/Glob SerPl: 1.6 (ref 0.7–1.7)
Alpha 1: 0.2 g/dL (ref 0.0–0.4)
Alpha2 Glob SerPl Elph-Mcnc: 0.5 g/dL (ref 0.4–1.0)
B-Globulin SerPl Elph-Mcnc: 0.9 g/dL (ref 0.7–1.3)
Gamma Glob SerPl Elph-Mcnc: 1.1 g/dL (ref 0.4–1.8)
Globulin, Total: 2.7 g/dL (ref 2.2–3.9)
IgA: 213 mg/dL (ref 61–437)
IgG (Immunoglobin G), Serum: 1174 mg/dL (ref 603–1613)
IgM (Immunoglobulin M), Srm: 56 mg/dL (ref 15–143)
Total Protein ELP: 6.8 g/dL (ref 6.0–8.5)

## 2020-09-18 ENCOUNTER — Other Ambulatory Visit (HOSPITAL_COMMUNITY): Payer: Self-pay

## 2020-09-28 DIAGNOSIS — I2510 Atherosclerotic heart disease of native coronary artery without angina pectoris: Secondary | ICD-10-CM | POA: Diagnosis not present

## 2020-10-06 ENCOUNTER — Encounter: Payer: Medicare HMO | Admitting: Internal Medicine

## 2020-10-09 DIAGNOSIS — Z23 Encounter for immunization: Secondary | ICD-10-CM | POA: Diagnosis not present

## 2020-10-13 ENCOUNTER — Ambulatory Visit (INDEPENDENT_AMBULATORY_CARE_PROVIDER_SITE_OTHER): Payer: Medicare HMO

## 2020-10-13 DIAGNOSIS — I480 Paroxysmal atrial fibrillation: Secondary | ICD-10-CM

## 2020-10-13 LAB — CUP PACEART REMOTE DEVICE CHECK
Battery Remaining Longevity: 180 mo
Battery Remaining Percentage: 100 %
Brady Statistic RA Percent Paced: 0 %
Brady Statistic RV Percent Paced: 1 %
Date Time Interrogation Session: 20220913041000
Implantable Lead Implant Date: 20220613
Implantable Lead Implant Date: 20220613
Implantable Lead Location: 753859
Implantable Lead Location: 753860
Implantable Lead Model: 7841
Implantable Lead Model: 7842
Implantable Lead Serial Number: 1074164
Implantable Lead Serial Number: 1156308
Implantable Pulse Generator Implant Date: 20220613
Lead Channel Impedance Value: 576 Ohm
Lead Channel Impedance Value: 625 Ohm
Lead Channel Pacing Threshold Amplitude: 0.5 V
Lead Channel Pacing Threshold Amplitude: 0.7 V
Lead Channel Pacing Threshold Pulse Width: 0.4 ms
Lead Channel Pacing Threshold Pulse Width: 0.4 ms
Lead Channel Setting Pacing Amplitude: 3.5 V
Lead Channel Setting Pacing Amplitude: 3.5 V
Lead Channel Setting Pacing Pulse Width: 0.4 ms
Lead Channel Setting Sensing Sensitivity: 2.5 mV
Pulse Gen Serial Number: 994472

## 2020-10-21 NOTE — Progress Notes (Signed)
Remote pacemaker transmission.   

## 2020-10-26 DIAGNOSIS — I2510 Atherosclerotic heart disease of native coronary artery without angina pectoris: Secondary | ICD-10-CM | POA: Diagnosis not present

## 2020-10-28 ENCOUNTER — Ambulatory Visit: Payer: Medicare HMO | Admitting: Internal Medicine

## 2020-10-28 ENCOUNTER — Encounter: Payer: Self-pay | Admitting: Internal Medicine

## 2020-10-28 ENCOUNTER — Other Ambulatory Visit: Payer: Self-pay

## 2020-10-28 VITALS — BP 128/80 | HR 65 | Ht 70.0 in | Wt 184.9 lb

## 2020-10-28 DIAGNOSIS — Z95810 Presence of automatic (implantable) cardiac defibrillator: Secondary | ICD-10-CM | POA: Diagnosis not present

## 2020-10-28 DIAGNOSIS — I480 Paroxysmal atrial fibrillation: Secondary | ICD-10-CM

## 2020-10-28 DIAGNOSIS — I453 Trifascicular block: Secondary | ICD-10-CM | POA: Diagnosis not present

## 2020-10-28 NOTE — Progress Notes (Signed)
Patient Care Team: Deland Pretty, MD as PCP - General (Internal Medicine)   HPI  Kevin Castro is a 75 y.o. male Seen in follow-up for atrial fib.with progressive trifasicular block and recurrent syncope  I saw him 2016 and he sought primary therapy with ablation which was done by JA 2017 cx by post procedural SOB  Had syncope assoc with AFib 2017:   See the note from [facility] abrupt decrease in heart rate (140-close to 0) associated with a syncopal event  No other documented episodes of his apple watch of abrupt changes in heart rate.  No interval syncope, atrial fibrillation has been detected on his device  Recently his PCP began him on aspirin and Crestor    DATE TEST EF   7/21 CTA   % Ca Score 0 RCA 0-25%   8/22 CTA  CaScore 10    Thromboembolic risk factors ( age -86) for a CHADSVASc Score of 1       Past Medical History:  Diagnosis Date   Atrial tachycardia (HCC)    Paroxysmal atrial fibrillation (HCC)    Prostate CA (HCC)    Sinus bradycardia    Trifascicular block     Past Surgical History:  Procedure Laterality Date   DOBUTAMINE STRESS ECHO     ELECTROPHYSIOLOGIC STUDY     ELECTROPHYSIOLOGIC STUDY N/A 10/16/2015   Procedure: Atrial Fibrillation Ablation;  Surgeon: Thompson Grayer, MD;  Location: [facility];  Service: Cardiovascular;  Laterality: N/A;   PACEMAKER IMPLANT N/A 07/13/2020   Procedure: PACEMAKER IMPLANT;  Surgeon: Deboraha Sprang, MD;  Location: [facility];  Service: Cardiovascular;  Laterality: N/A;    Current Outpatient Medications  Medication Sig Dispense Refill   acetaminophen (TYLENOL) 325 MG tablet Take 650 mg by mouth daily as needed (pain). Rarely takes     CALCIUM-VITAMIN D PO Take 600 mg elemental calcium/kg/hr by mouth 2 (two) times daily.     Glucosamine-Chondroit-Vit C-Mn (GLUCOSAMINE 1500 COMPLEX PO) Take 1,500 mg by mouth 2 (two) times daily. 1500 mg / 1200 mg     Multiple Vitamin (MULTI-VITAMIN) tablet Take 1  tablet by mouth daily.     naproxen sodium (ALEVE) 220 MG tablet Take 220-440 mg by mouth daily as needed (pain).     Omega-3 Fatty Acids (FISH OIL) 1000 MG CAPS Take 1,000 mg by mouth 2 (two) times daily.     rosuvastatin (CRESTOR) 5 MG tablet Take 5 mg by mouth daily.     No current facility-administered medications for this visit.    Allergies  Allergen Reactions   Penicillins Rash    Rash/blisters Has patient had a PCN reaction causing immediate rash, facial/tongue/throat swelling, SOB or lightheadedness with hypotension: Yes Has patient had a PCN reaction causing severe rash involving mucus membranes or skin necrosis: No Has patient had a PCN reaction that required hospitalization No Has patient had a PCN reaction occurring within the last 10 years: No If all of the above answers are "NO", then may proceed with Cephalosporin use.       Review of Systems negative except from HPI and PMH BP 128/80   Pulse 65   Ht 5\' 10"  (1.778 m)   Wt 184 lb 14.4 oz (83.9 kg)   SpO2 98%   BMI 26.53 kg/m   Well developed and well nourished in no acute distress HENT normal Neck supple with JVP-flat Clear Device pocket well healed; without hematoma or erythema.  There is  no tethering  Regular rate and rhythm, no  murmur Abd-soft with active BS No Clubbing cyanosis edema Skin-warm and dry A & Oriented  Grossly normal sensory and motor function  ECG  sinus at 65 Interval 22/14/46 Axis left -79 Right bundle branch block   Assessment and  Plan  Trifascicular block  Syncope  Pacemaker [employer] device reprogrammed  Ca Score now 10  Palpitations-- frequent atrial ectopy   Atrial fibrillation/flutter prior ablation JA     No interval syncope.  Device function is normal  Interval calcium score repeated by his PCP now can no longer 0.  10-year risk is 17.5% so concur with Dr. Concha Pyo about the use of a statin although it may be reasonable to increase it and adjunctive  aspirin.  Has atrial fibrillation detected on his device but remains in the SCAF category less than 2 hours.  No indication at this time for anticoagulation

## 2020-10-28 NOTE — Patient Instructions (Signed)
Medication Instructions:  Your physician recommends that you continue on your current medications as directed. Please refer to the Current Medication list given to you today.  *If you need a refill on your cardiac medications before your next appointment, please call your pharmacy*   Lab Work: None ordered.  If you have labs (blood work) drawn today and your tests are completely normal, you will receive your results only by: . MyChart Message (if you have MyChart) OR . A paper copy in the mail If you have any lab test that is abnormal or we need to change your treatment, we will call you to review the results.   Testing/Procedures: None ordered.    Follow-Up: At [facility], you and your health needs are our priority.  As part of our continuing mission to provide you with exceptional heart care, we have created designated Provider Care Teams.  These Care Teams include your primary Cardiologist (physician) and Advanced Practice Providers (APPs -  Physician Assistants and Nurse Practitioners) who all work together to provide you with the care you need, when you need it.  We recommend signing up for the patient portal called "MyChart".  Sign up information is provided on this After Visit Summary.  MyChart is used to connect with patients for Virtual Visits (Telemedicine).  Patients are able to view lab/test results, encounter notes, upcoming appointments, etc.  Non-urgent messages can be sent to your provider as well.   To learn more about what you can do with MyChart, go to [email or web address].    Your next appointment:   9 months  The format for your next appointment:   In Person  Provider:   Steven Klein, MD     

## 2020-12-01 ENCOUNTER — Telehealth: Payer: Self-pay

## 2020-12-01 ENCOUNTER — Encounter: Payer: Self-pay | Admitting: Internal Medicine

## 2020-12-01 ENCOUNTER — Telehealth: Payer: Self-pay | Admitting: Internal Medicine

## 2020-12-01 MED ORDER — RIVAROXABAN 20 MG PO TABS: 20.0000 mg | ORAL_TABLET | Freq: Every day | ORAL | 11 refills | Status: DC

## 2020-12-01 NOTE — Telephone Encounter (Signed)
Dr. Lovena Le returned call and left message with Dr. Olevia Perches to call back.  Await further needs.

## 2020-12-01 NOTE — Telephone Encounter (Signed)
Error

## 2020-12-01 NOTE — Telephone Encounter (Signed)
Transmission received 3:18 pm

## 2020-12-01 NOTE — Telephone Encounter (Signed)
Transmission received.

## 2020-12-01 NOTE — Telephone Encounter (Signed)
Per Dr Caryl Comes pt will need to be provided with Xarelto 20mg  samples.  #30 - Take 1 tablet by mouth with supper.

## 2020-12-01 NOTE — Telephone Encounter (Signed)
Received call from Dr. Eustace Quail, requesting to speak with DOD regarding this patient. Call was disconnected before I was able to transfer.

## 2020-12-02 ENCOUNTER — Ambulatory Visit: Payer: Medicare HMO | Admitting: Internal Medicine

## 2020-12-02 ENCOUNTER — Other Ambulatory Visit: Payer: Self-pay

## 2020-12-02 VITALS — BP 134/66 | HR 67 | Ht 70.0 in | Wt 185.6 lb

## 2020-12-02 DIAGNOSIS — I453 Trifascicular block: Secondary | ICD-10-CM | POA: Diagnosis not present

## 2020-12-02 DIAGNOSIS — Z95810 Presence of automatic (implantable) cardiac defibrillator: Secondary | ICD-10-CM | POA: Diagnosis not present

## 2020-12-02 DIAGNOSIS — R002 Palpitations: Secondary | ICD-10-CM | POA: Diagnosis not present

## 2020-12-02 DIAGNOSIS — I480 Paroxysmal atrial fibrillation: Secondary | ICD-10-CM | POA: Diagnosis not present

## 2020-12-02 NOTE — Progress Notes (Signed)
Patient Care Team: Deland Pretty, MD as PCP - General (Internal Medicine)   HPI  Kevin Castro is a 75 y.o. 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 2022 syncope with abrupt decrease in heart rate (140-close to 0) >> with underlying trifascicular block>> pacemaker [employer] 6/22.   Called yday by BB, riding with pt and had persistent tachycardia which on transmission was aflutter 2:1 AVblock.  Self terminating after about 4.5 hrs.  Assoc with unsettledness and exercise intolerance but no LH Prev episodes have been asymptomatic  Also complained of tachycardia following half marathon over weekend ( nothing detected on device_    DATE TEST EF   7/21 CTA   % Ca Score 0 RCA 0-25%   8/22 CTA  CaScore 10    Date Cr K Hgb LDL  8/22 1.04 4.7 12.4<<13.2           Ferritin 102  FESat 26%. Myeloma panel & B12 normal  Thromboembolic risk factors ( age -75) for a CHADSVASc Score of 1       Past Medical History:  Diagnosis Date   Atrial tachycardia (HCC)    Paroxysmal atrial fibrillation (HCC)    Prostate CA (HCC)    Sinus bradycardia    Trifascicular block     Past Surgical History:  Procedure Laterality Date   DOBUTAMINE STRESS ECHO     ELECTROPHYSIOLOGIC STUDY     ELECTROPHYSIOLOGIC STUDY N/A 10/16/2015   Procedure: Atrial Fibrillation Ablation;  Surgeon: Thompson Grayer, MD;  Location: [facility];  Service: Cardiovascular;  Laterality: N/A;   PACEMAKER IMPLANT N/A 07/13/2020   Procedure: PACEMAKER IMPLANT;  Surgeon: Deboraha Sprang, MD;  Location: [facility];  Service: Cardiovascular;  Laterality: N/A;    Current Outpatient Medications  Medication Sig Dispense Refill   acetaminophen (TYLENOL) 325 MG tablet Take 650 mg by mouth daily as needed (pain). Rarely takes     aspirin 81 MG chewable tablet Chew 1 tablet by mouth once a week.      CALCIUM-VITAMIN D PO Take 600 mg elemental calcium/kg/hr by mouth 2 (two) times daily.     Glucosamine-Chondroit-Vit C-Mn (GLUCOSAMINE 1500 COMPLEX PO) Take 1,500 mg by mouth 2 (two) times daily. 1500 mg / 1200 mg     Multiple Vitamin (MULTI-VITAMIN) tablet Take 1 tablet by mouth daily.     naproxen sodium (ALEVE) 220 MG tablet Take 220-440 mg by mouth daily as needed (pain).     Omega-3 Fatty Acids (FISH OIL) 1000 MG CAPS Take 1,000 mg by mouth 2 (two) times daily.     rivaroxaban (XARELTO) 20 MG TABS tablet Take 1 tablet (20 mg total) by mouth daily with supper. 30 tablet 11   rosuvastatin (CRESTOR) 5 MG tablet Take 5 mg by mouth daily.     No current facility-administered medications for this visit.    Allergies  Allergen Reactions   Penicillins Rash    Rash/blisters Has patient had a PCN reaction causing immediate rash, facial/tongue/throat swelling, SOB or lightheadedness with hypotension: Yes Has patient had a PCN reaction causing severe rash involving mucus membranes or skin necrosis: No Has patient had a PCN reaction that required hospitalization No Has patient had a PCN reaction occurring within the last 10 years: No If all of the above answers are "NO", then may proceed with Cephalosporin use.  Review of Systems negative except from HPI and PMH BP 134/66   Pulse 67   Ht 5\' 10"  (1.778 m)   Wt 185 lb 9.6 oz (84.2 kg)   SpO2 99%   BMI 26.63 kg/m  Well developed and well nourished in no acute distress HENT normal Neck supple with JVP-flat Clear Device pocket well healed; without hematoma or erythema.  There is no tethering  Regular rate and rhythm, no  gallop No murmur Abd-soft with active BS No Clubbing cyanosis  edema Skin-warm and dry A & Oriented  Grossly normal sensory and motor function     Assessment and  Plan  Trifascicular block  Syncope  Pacemaker [employer] device    Ca Score now 10  Palpitations-- frequent atrial ectopy   Atrial  fibrillation/flutter prior ablation JA  Recurrent Atrial flutter Dur< 5 hrs // SCAF        Recurrent atrial flutter--no ECG but hx of prev ablation of CTI at the the time of PVI, likely then related to ablation lines  The frequency of the events should inform therapy, from prn to preventative, so for now will give Rx for verapamil 40 to take as needed   we discussed antiarrhythmic strategies including drugs and repeat ablation if the frequency of recurrent events dictates  We reviewed the data from ASSERT 2 and the duration of SCAF that is assoc with TE risk being > 24 hrs which remains part of the guideline recommendations.  So for now, if he has Tachypalps at the end of day, he will begin Rivaroxaban and call us in the am if still with tachypalps and would proceed with DCCV   Reviewed again the implications CA score as reflecting of coronary atherosclerosis so in addition to statin will recommend ASA, he would like not to take daily, so will start qod

## 2020-12-02 NOTE — Patient Instructions (Signed)
Medication Instructions:  - Your physician recommends that you continue on your current medications as directed. Please refer to the Current Medication list given to you today.  *If you need a refill on your cardiac medications before your next appointment, please call your pharmacy*   Lab Work: - none ordered  If you have labs (blood work) drawn today and your tests are completely normal, you will receive your results only by: [place] (if you have MyChart) OR A paper copy in the mail If you have any lab test that is abnormal or we need to change your treatment, we will call you to review the results.   Testing/Procedures: - none ordered   Follow-Up: At [facility], you and your health needs are our priority.  As part of our continuing mission to provide you with exceptional heart care, we have created designated Provider Care Teams.  These Care Teams include your primary Cardiologist (physician) and Advanced Practice Providers (APPs -  Physician Assistants and Nurse Practitioners) who all work together to provide you with the care you need, when you need it.  We recommend signing up for the patient portal called "MyChart".  Sign up information is provided on this After Visit Summary.  MyChart is used to connect with patients for Virtual Visits (Telemedicine).  Patients are able to view lab/test results, encounter notes, upcoming appointments, etc.  Non-urgent messages can be sent to your provider as well.   To learn more about what you can do with MyChart, go to [email or web address].    Your next appointment:   In June 2023   The format for your next appointment:   In Person  Provider:   Virl Axe, MD   Other Instructions N/a

## 2020-12-04 ENCOUNTER — Telehealth: Payer: Self-pay | Admitting: Internal Medicine

## 2020-12-04 MED ORDER — VERAPAMIL HCL 40 MG PO TABS: ORAL_TABLET | ORAL | 0 refills | Status: DC

## 2020-12-04 NOTE — Telephone Encounter (Signed)
Secure chat received from Dr. Caryl Comes advising the patient will need verapamil 40 mg: to take as need for tachycardia can repeat in 30 min x 1    RN not made aware of this at the time of the patient's visit.  I have attempted to reach the patient by phone. No answer- I left a message to please call back.

## 2020-12-04 NOTE — Telephone Encounter (Signed)
Encounter opened in error. Disregard.

## 2020-12-04 NOTE — Telephone Encounter (Signed)
Reviewed the patient's chart. I had not tried to call him post appointment on 12/02/20. I looked back at Dr. Olin Pia note and there is mention of a PRN verapamil RX.  I have sent a secure chat to Dr. Caryl Comes to please advise.

## 2020-12-04 NOTE — Telephone Encounter (Signed)
Patient states he is returning a call received after 11/02 appointment with Dr. Caryl Comes. He states he did receive a voicemail regarding starting a new medication, but he was unable to make out the instructions. Please return call to discuss.

## 2020-12-04 NOTE — Telephone Encounter (Signed)
I spoke with the patient. I have made him aware of Dr. Olin Pia recommendations as below with verapamil 40 mg tablets.  The patient voices understanding and is agreeable.  RX sent to [facility] in [facility].

## 2020-12-11 ENCOUNTER — Other Ambulatory Visit: Payer: Self-pay

## 2020-12-11 ENCOUNTER — Inpatient Hospital Stay: Payer: Medicare HMO | Attending: Oncology

## 2020-12-11 ENCOUNTER — Inpatient Hospital Stay: Payer: Medicare HMO | Admitting: Oncology

## 2020-12-11 VITALS — BP 122/71 | HR 60 | Temp 98.1°F | Resp 19 | Ht 70.0 in | Wt 184.8 lb

## 2020-12-11 DIAGNOSIS — Z8546 Personal history of malignant neoplasm of prostate: Secondary | ICD-10-CM | POA: Diagnosis not present

## 2020-12-11 DIAGNOSIS — I4891 Unspecified atrial fibrillation: Secondary | ICD-10-CM | POA: Diagnosis not present

## 2020-12-11 DIAGNOSIS — D649 Anemia, unspecified: Secondary | ICD-10-CM | POA: Diagnosis not present

## 2020-12-11 DIAGNOSIS — Z950 Presence of cardiac pacemaker: Secondary | ICD-10-CM | POA: Diagnosis not present

## 2020-12-11 LAB — CBC WITH DIFFERENTIAL (CANCER CENTER ONLY)
Abs Immature Granulocytes: 0.01 10*3/uL (ref 0.00–0.07)
Basophils Absolute: 0 10*3/uL (ref 0.0–0.1)
Basophils Relative: 1 %
Eosinophils Absolute: 0.2 10*3/uL (ref 0.0–0.5)
Eosinophils Relative: 7 %
HCT: 34.6 % — ABNORMAL LOW (ref 39.0–52.0)
Hemoglobin: 11.9 g/dL — ABNORMAL LOW (ref 13.0–17.0)
Immature Granulocytes: 0 %
Lymphocytes Relative: 24 %
Lymphs Abs: 0.8 10*3/uL (ref 0.7–4.0)
MCH: 32.2 pg (ref 26.0–34.0)
MCHC: 34.4 g/dL (ref 30.0–36.0)
MCV: 93.8 fL (ref 80.0–100.0)
Monocytes Absolute: 0.4 10*3/uL (ref 0.1–1.0)
Monocytes Relative: 11 %
Neutro Abs: 2 10*3/uL (ref 1.7–7.7)
Neutrophils Relative %: 57 %
Platelet Count: 176 10*3/uL (ref 150–400)
RBC: 3.69 MIL/uL — ABNORMAL LOW (ref 4.22–5.81)
RDW: 13.2 % (ref 11.5–15.5)
WBC Count: 3.4 10*3/uL — ABNORMAL LOW (ref 4.0–10.5)
nRBC: 0 % (ref 0.0–0.2)

## 2020-12-11 MED ORDER — VERAPAMIL HCL 40 MG PO TABS: ORAL_TABLET | ORAL | 6 refills | Status: DC

## 2020-12-11 NOTE — Progress Notes (Signed)
  [facility] [facility] OFFICE PROGRESS NOTE   Diagnosis: Anemia  INTERVAL HISTORY:   Kevin Castro returns as scheduled.  He feels well.  He reports rectal bleeding with bowel movements for the past 4-5 months.  No other bleeding.  He relates the bleeding to radiation changes in the rectum.  He is due for a colonoscopy.  Objective:  Vital signs in last 24 hours:  Blood pressure 122/71, pulse 60, temperature 98.1 F (36.7 C), temperature source Oral, resp. rate 19, height 5\' 10"  (1.778 m), weight 184 lb 12.8 oz (83.8 kg), SpO2 100 %.     Resp: Lungs clear bilaterally Cardio: Irregular GI: No hepatosplenomegaly Vascular: No leg edema   Lab Results:  Lab Results  Component Value Date   WBC 3.4 (L) 12/11/2020   HGB 11.9 (L) 12/11/2020   HCT 34.6 (L) 12/11/2020   MCV 93.8 12/11/2020   PLT 176 12/11/2020   NEUTROABS 2.0 12/11/2020    CMP  Lab Results  Component Value Date   NA 139 09/07/2020   K 4.7 09/07/2020   CL 104 09/07/2020   CO2 30 09/07/2020   GLUCOSE 97 09/07/2020   BUN 19 09/07/2020   CREATININE 1.04 09/07/2020   CALCIUM 10.0 09/07/2020   PROT 6.9 09/07/2020   ALBUMIN 4.1 09/07/2020   AST 27 09/07/2020   ALT 18 09/07/2020   ALKPHOS 49 09/07/2020   BILITOT 0.9 09/07/2020   GFRNONAA >60 09/07/2020   GFRAA 78 08/21/2019    Medications: I have reviewed the patient's current medications.   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  The anemia is most likely related to treatment for prostate cancer.  However  he reports a history of anemia predating the history of prostate cancer (we do not have remote CBC data available here).  The differential diagnosis includes myelodysplasia.  A laboratory evaluation in August including iron studies, a vitamin B12 level, and myeloma panel returned negative.  I recommend continued observation.  He will return for an office visit and CBC in 4 months.  He will obtain remote CBCs and bring these to our office.  He reports rectal bleeding with bowel movements.  He declined a rectal exam today.  He plans to see Dr. Diona Fanti for a prostate/rectal exam next month.  I encouraged him to schedule a colonoscopy.  Betsy Coder, MD  12/11/2020  9:28 AM

## 2020-12-11 NOTE — Telephone Encounter (Signed)
This is a Kevin Castro pt 

## 2021-01-08 DIAGNOSIS — C61 Malignant neoplasm of prostate: Secondary | ICD-10-CM | POA: Diagnosis not present

## 2021-01-12 ENCOUNTER — Ambulatory Visit (INDEPENDENT_AMBULATORY_CARE_PROVIDER_SITE_OTHER): Payer: Medicare HMO

## 2021-01-12 DIAGNOSIS — I452 Bifascicular block: Secondary | ICD-10-CM

## 2021-01-12 LAB — CUP PACEART REMOTE DEVICE CHECK
Battery Remaining Longevity: 180 mo
Battery Remaining Percentage: 100 %
Brady Statistic RA Percent Paced: 0 %
Brady Statistic RV Percent Paced: 0 %
Date Time Interrogation Session: 20221213041200
Implantable Lead Implant Date: 20220613
Implantable Lead Implant Date: 20220613
Implantable Lead Location: 753859
Implantable Lead Location: 753860
Implantable Lead Model: 7841
Implantable Lead Model: 7842
Implantable Lead Serial Number: 1074164
Implantable Lead Serial Number: 1156308
Implantable Pulse Generator Implant Date: 20220613
Lead Channel Impedance Value: 603 Ohm
Lead Channel Impedance Value: 609 Ohm
Lead Channel Pacing Threshold Amplitude: 0.5 V
Lead Channel Pacing Threshold Amplitude: 0.6 V
Lead Channel Pacing Threshold Pulse Width: 0.4 ms
Lead Channel Pacing Threshold Pulse Width: 0.4 ms
Lead Channel Setting Pacing Amplitude: 2 V
Lead Channel Setting Pacing Amplitude: 2.5 V
Lead Channel Setting Pacing Pulse Width: 0.4 ms
Lead Channel Setting Sensing Sensitivity: 2.5 mV
Pulse Gen Serial Number: 994472

## 2021-01-15 DIAGNOSIS — Z8546 Personal history of malignant neoplasm of prostate: Secondary | ICD-10-CM | POA: Diagnosis not present

## 2021-01-15 DIAGNOSIS — R350 Frequency of micturition: Secondary | ICD-10-CM | POA: Diagnosis not present

## 2021-01-15 DIAGNOSIS — N401 Enlarged prostate with lower urinary tract symptoms: Secondary | ICD-10-CM | POA: Diagnosis not present

## 2021-01-21 NOTE — Progress Notes (Signed)
Remote pacemaker transmission.   

## 2021-01-29 DIAGNOSIS — M5030 Other cervical disc degeneration, unspecified cervical region: Secondary | ICD-10-CM | POA: Diagnosis not present

## 2021-01-29 DIAGNOSIS — I2510 Atherosclerotic heart disease of native coronary artery without angina pectoris: Secondary | ICD-10-CM | POA: Diagnosis not present

## 2021-01-29 DIAGNOSIS — E7800 Pure hypercholesterolemia, unspecified: Secondary | ICD-10-CM | POA: Diagnosis not present

## 2021-01-29 DIAGNOSIS — D649 Anemia, unspecified: Secondary | ICD-10-CM | POA: Diagnosis not present

## 2021-02-12 DIAGNOSIS — K625 Hemorrhage of anus and rectum: Secondary | ICD-10-CM | POA: Diagnosis not present

## 2021-02-12 DIAGNOSIS — K5900 Constipation, unspecified: Secondary | ICD-10-CM | POA: Diagnosis not present

## 2021-02-12 DIAGNOSIS — Z86010 Personal history of colonic polyps: Secondary | ICD-10-CM | POA: Diagnosis not present

## 2021-02-18 DIAGNOSIS — H40023 Open angle with borderline findings, high risk, bilateral: Secondary | ICD-10-CM | POA: Diagnosis not present

## 2021-02-18 DIAGNOSIS — H5213 Myopia, bilateral: Secondary | ICD-10-CM | POA: Diagnosis not present

## 2021-03-19 DIAGNOSIS — K649 Unspecified hemorrhoids: Secondary | ICD-10-CM | POA: Diagnosis not present

## 2021-03-19 DIAGNOSIS — Z86010 Personal history of colonic polyps: Secondary | ICD-10-CM | POA: Diagnosis not present

## 2021-03-19 DIAGNOSIS — K5520 Angiodysplasia of colon without hemorrhage: Secondary | ICD-10-CM | POA: Diagnosis not present

## 2021-04-01 DIAGNOSIS — H2513 Age-related nuclear cataract, bilateral: Secondary | ICD-10-CM | POA: Diagnosis not present

## 2021-04-01 DIAGNOSIS — H25013 Cortical age-related cataract, bilateral: Secondary | ICD-10-CM | POA: Diagnosis not present

## 2021-04-09 ENCOUNTER — Inpatient Hospital Stay: Payer: Medicare HMO | Admitting: Oncology

## 2021-04-09 ENCOUNTER — Other Ambulatory Visit: Payer: Self-pay

## 2021-04-09 ENCOUNTER — Inpatient Hospital Stay: Payer: Medicare HMO | Attending: Oncology

## 2021-04-09 VITALS — BP 130/74 | HR 66 | Temp 97.8°F | Resp 20 | Ht 70.0 in | Wt 196.6 lb

## 2021-04-09 DIAGNOSIS — C61 Malignant neoplasm of prostate: Secondary | ICD-10-CM | POA: Diagnosis not present

## 2021-04-09 DIAGNOSIS — D649 Anemia, unspecified: Secondary | ICD-10-CM | POA: Insufficient documentation

## 2021-04-09 DIAGNOSIS — I4891 Unspecified atrial fibrillation: Secondary | ICD-10-CM | POA: Diagnosis not present

## 2021-04-09 DIAGNOSIS — D72829 Elevated white blood cell count, unspecified: Secondary | ICD-10-CM | POA: Diagnosis not present

## 2021-04-09 DIAGNOSIS — Z950 Presence of cardiac pacemaker: Secondary | ICD-10-CM | POA: Insufficient documentation

## 2021-04-09 LAB — CBC WITH DIFFERENTIAL (CANCER CENTER ONLY)
Abs Immature Granulocytes: 0.01 10*3/uL (ref 0.00–0.07)
Basophils Absolute: 0 10*3/uL (ref 0.0–0.1)
Basophils Relative: 1 %
Eosinophils Absolute: 0.2 10*3/uL (ref 0.0–0.5)
Eosinophils Relative: 5 %
HCT: 36.5 % — ABNORMAL LOW (ref 39.0–52.0)
Hemoglobin: 12.5 g/dL — ABNORMAL LOW (ref 13.0–17.0)
Immature Granulocytes: 0 %
Lymphocytes Relative: 25 %
Lymphs Abs: 0.9 10*3/uL (ref 0.7–4.0)
MCH: 31.9 pg (ref 26.0–34.0)
MCHC: 34.2 g/dL (ref 30.0–36.0)
MCV: 93.1 fL (ref 80.0–100.0)
Monocytes Absolute: 0.4 10*3/uL (ref 0.1–1.0)
Monocytes Relative: 12 %
Neutro Abs: 1.9 10*3/uL (ref 1.7–7.7)
Neutrophils Relative %: 57 %
Platelet Count: 184 10*3/uL (ref 150–400)
RBC: 3.92 MIL/uL — ABNORMAL LOW (ref 4.22–5.81)
RDW: 13 % (ref 11.5–15.5)
WBC Count: 3.4 10*3/uL — ABNORMAL LOW (ref 4.0–10.5)
nRBC: 0 % (ref 0.0–0.2)

## 2021-04-09 NOTE — Progress Notes (Signed)
?  [place] ?OFFICE PROGRESS NOTE ? ? ?Diagnosis: Anemia ? ?INTERVAL HISTORY:  ? ?Kevin Castro turns for scheduled visit.  He has hemorrhoid bleeding when straining for bowel movement.  No other bleeding.  He is cycling several days per week and running.  Good appetite.  He underwent a colonoscopy with Dr. Watt Climes on on 03/19/2021.  Hemorrhoids were found on perianal exam.  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 ? ?Objective: ? ?Vital signs in last 24 hours: ? ?Blood pressure 130/74, pulse 66, temperature 97.8 ?F (36.6 ?C), temperature source Oral, resp. rate 20, height '5\' 10"'$  (1.778 m), weight 196 lb 9.6 oz (89.2 kg), SpO2 100 %. ?  ? ?Lymphatics: No cervical, supraclavicular, or inguinal nodes.  "Shotty "bilateral axillary nodes ?Resp: Lungs clear bilaterally ?Cardio: Irregular ?GI: No hepatosplenomegaly ?Vascular: No leg edema ? ?Lab Results: ? ?Lab Results  ?Component Value Date  ? WBC 3.4 (L) 04/09/2021  ? HGB 12.5 (L) 04/09/2021  ? HCT 36.5 (L) 04/09/2021  ? MCV 93.1 04/09/2021  ? PLT 184 04/09/2021  ? NEUTROABS 1.9 04/09/2021  ? ? ?CMP  ?Lab Results  ?Component Value Date  ? NA 139 09/07/2020  ? K 4.7 09/07/2020  ? CL 104 09/07/2020  ? CO2 30 09/07/2020  ? GLUCOSE 97 09/07/2020  ? BUN 19 09/07/2020  ? CREATININE 1.04 09/07/2020  ? CALCIUM 10.0 09/07/2020  ? PROT 6.9 09/07/2020  ? ALBUMIN 4.1 09/07/2020  ? AST 27 09/07/2020  ? ALT 18 09/07/2020  ? ALKPHOS 49 09/07/2020  ? BILITOT 0.9 09/07/2020  ? GFRNONAA >60 09/07/2020  ? GFRAA 78 08/21/2019  ? ?Medications: I have reviewed the patient's current medications. ? ? 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 ? ? ?Disposition: ? Kevin Castro appears well.  He has stable mild anemia.  He also has mild leukopenia.  The anemia and leukopenia could be benign normal variants are related to treatment of prostate cancer.  He will bring in CBCs predating his treatment of prostate cancer. ? ?He would like to continue follow-up in the hematology clinic.  He will return for an office visit in 6 months.  I do not recommend further diagnostic evaluation at present. ? ?Betsy Coder, MD ? ?04/09/2021  ?8:08 AM ? ? ?

## 2021-04-11 ENCOUNTER — Encounter: Payer: Self-pay | Admitting: Oncology

## 2021-04-13 ENCOUNTER — Ambulatory Visit (INDEPENDENT_AMBULATORY_CARE_PROVIDER_SITE_OTHER): Payer: Medicare HMO

## 2021-04-13 DIAGNOSIS — I480 Paroxysmal atrial fibrillation: Secondary | ICD-10-CM | POA: Diagnosis not present

## 2021-04-13 LAB — CUP PACEART REMOTE DEVICE CHECK
Battery Remaining Longevity: 180 mo
Battery Remaining Percentage: 100 %
Brady Statistic RA Percent Paced: 1 %
Brady Statistic RV Percent Paced: 0 %
Date Time Interrogation Session: 20230314041100
Implantable Lead Implant Date: 20220613
Implantable Lead Implant Date: 20220613
Implantable Lead Location: 753859
Implantable Lead Location: 753860
Implantable Lead Model: 7841
Implantable Lead Model: 7842
Implantable Lead Serial Number: 1074164
Implantable Lead Serial Number: 1156308
Implantable Pulse Generator Implant Date: 20220613
Lead Channel Impedance Value: 567 Ohm
Lead Channel Impedance Value: 633 Ohm
Lead Channel Pacing Threshold Amplitude: 0.5 V
Lead Channel Pacing Threshold Amplitude: 0.7 V
Lead Channel Pacing Threshold Pulse Width: 0.4 ms
Lead Channel Pacing Threshold Pulse Width: 0.4 ms
Lead Channel Setting Pacing Amplitude: 2 V
Lead Channel Setting Pacing Amplitude: 2.5 V
Lead Channel Setting Pacing Pulse Width: 0.4 ms
Lead Channel Setting Sensing Sensitivity: 2.5 mV
Pulse Gen Serial Number: 994472

## 2021-04-16 DIAGNOSIS — Z8546 Personal history of malignant neoplasm of prostate: Secondary | ICD-10-CM | POA: Diagnosis not present

## 2021-04-27 NOTE — Progress Notes (Signed)
Remote pacemaker transmission.   

## 2021-05-04 DIAGNOSIS — H52201 Unspecified astigmatism, right eye: Secondary | ICD-10-CM | POA: Diagnosis not present

## 2021-05-04 DIAGNOSIS — H2511 Age-related nuclear cataract, right eye: Secondary | ICD-10-CM | POA: Diagnosis not present

## 2021-05-04 DIAGNOSIS — H25011 Cortical age-related cataract, right eye: Secondary | ICD-10-CM | POA: Diagnosis not present

## 2021-05-04 DIAGNOSIS — H25811 Combined forms of age-related cataract, right eye: Secondary | ICD-10-CM | POA: Diagnosis not present

## 2021-05-16 NOTE — Progress Notes (Signed)
?Cardiology Office Note:   ?Date:  05/19/2021  ?NAME:  Kevin Castro    ?MRN: [id number] ?DOB:  03-31-1945  ? ?PCP:  Deland Pretty, MD  ?Cardiologist:  None  ?Electrophysiologist:  None  ? ?Referring MD: Deland Pretty, MD  ? ?Chief Complaint  ?Patient presents with  ? Follow-up  ?   ?  ? ?History of Present Illness:   ?Kevin Castro is a 76 y.o. male with a hx of Afib s/p ablation, CHB s/p ppm, coronary calcium who presents for follow-up.  He follows with Jolyn Nap for A-fib.  History of ablation.  He reports for the past few years he has had intense neck pain.  He describes this as pain in the back of his neck that occurs with high intensity activity.  He currently exercises pretty heavily as a cyclist.  He tells me he can ride 30 to 40 miles with no limitations.  Apparently when he tries to go to a very fast-paced he gets this achy pain in the back of his neck.  No chest pain.  He does get short of breath which is largely expected.  He reports he has been seen by orthopedics with no definitive diagnosis.  Coronary CTA in 2017 was with no obstructive CAD.  He does ride with an interventional cardiologist who has concerns for possible microvascular dysfunction.  He can ride his bike at a slower pace without limitations.  He has never had a heart attack or stroke.  He is a former smoker.  No strong family history of heart disease.  He is retired Art gallery manager.  His lab work demonstrates total cholesterol 193, HDL 88, LDL 90, triglycerides 38.  He does take aspirin which I encouraged him not to do.  No strong indication for this.  Cardiovascular lamination unremarkable.  EKG demonstrates sinus rhythm with PACs and trifascicular block.  Symptoms appear to occur with exertion and not occur at rest. ? ?Problem List ?Atrial fibrillation  ?-PVI 2017 ?2. CHB s/p ppm ?3. Coronary calcium ?-score 10 (15th percentile) ? ?Past Medical History: ?Past Medical History:  ?Diagnosis Date  ? Atrial tachycardia ([place])   ?  Paroxysmal atrial fibrillation (H[facility])   ? Prostate CA [facility]   ? Sinus bradycardia   ? Trifascicular block   ? ? ?Past Surgical History: ?Past Surgical History:  ?Procedure Laterality Date  ? DOBUTAMINE STRESS ECHO    ? ELECTROPHYSIOLOGIC STUDY    ? ELECTROPHYSIOLOGIC STUDY N/A 10/16/2015  ? Procedure: Atrial Fibrillation Ablation;  Surgeon: Thompson Grayer, MD;  Location: [facility];  Service: Cardiovascular;  Laterality: N/A;  ? PACEMAKER IMPLANT N/A 07/13/2020  ? Procedure: PACEMAKER IMPLANT;  Surgeon: Deboraha Sprang, MD;  Location: [place] [facility];  Service: Cardiovascular;  Laterality: N/A;  ? ? ?Current Medications: ?Current Meds  ?Medication Sig  ? aspirin 81 MG chewable tablet Chew 1 tablet by mouth once a week.  ? Calcium Carb-Cholecalciferol 600-5 MG-MCG TABS 1 tablet  ? Difluprednate 0.05 % EMUL Place 1 drop into the right eye 2 (two) times daily.  ? Glucosamine-Chondroit-Vit C-Mn (GLUCOSAMINE 1500 COMPLEX PO) Take 1,500 mg by mouth daily. 1500 mg / 1200 mg  ? Multiple Vitamin (MULTI-VITAMIN) tablet Take 1 tablet by mouth daily.  ? Omega-3 Fatty Acids (FISH OIL) 1000 MG CAPS Take 1,000 mg by mouth daily.  ? rosuvastatin (CRESTOR) 5 MG tablet Take 5 mg by mouth daily.  ? tamsulosin (FLOMAX) 0.4 MG CAPS capsule 0.4 mg daily.  ?  ? ?  Allergies:    ?Penicillins  ? ?Social History: ?Social History  ? ?Socioeconomic History  ? Marital status: Married  ?  Spouse name: Not on file  ? Number of children: 2  ? Years of education: Not on file  ? Highest education level: Not on file  ?Occupational History  ? Occupation: Retired Art gallery manager  ?Tobacco Use  ? Smoking status: Former  ?  Packs/day: 0.75  ?  Years: 7.00  ?  Pack years: 5.25  ?  Types: Cigarettes  ?  Quit date: 01/31/1973  ?  Years since quitting: 48.3  ? Smokeless tobacco: Never  ?Vaping Use  ? Vaping Use: Never used  ?Substance and Sexual Activity  ? Alcohol use: Yes  ?  Alcohol/week: 12.0 standard drinks  ?  Types: 12 Cans of beer per week  ?   Comment: "can go through a couple six packs on the weekend"  ? Drug use: No  ? Sexual activity: Yes  ?Other Topics Concern  ? Not on file  ?Social History Narrative  ? Two adopted children  ? ?Social Determinants of Health  ? ?Financial Resource Strain: Not on file  ?Food Insecurity: Not on file  ?Transportation Needs: Not on file  ?Physical Activity: Not on file  ?Stress: Not on file  ?Social Connections: Not on file  ?  ? ?Family History: ?The patient's family history includes Arrhythmia in his brother, mother, and sister; Breast cancer in his cousin; Diabetes in his brother; Liver disease in his father; Lung cancer in his father; Stroke in his mother. There is no history of Heart attack, Hypertension, Prostate cancer, Pancreatic cancer, or Colon cancer. ? ?ROS:   ?All other ROS reviewed and negative. Pertinent positives noted in the HPI.    ? ?EKGs/Labs/Other Studies Reviewed:   ?The following studies were personally reviewed by me today: ? ?EKG:  EKG is ordered today.  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  ? ?CTA 08/29/2019 ?IMPRESSION: ?1. Coronary calcium score of 0. This was 0 percentile for age and ?sex matched control. There was no calcified plaque. ?  ?2. Normal coronary origin with right dominance. ?  ?3. There is a mild proximal PDA stenosis of 0-24%, non calcified ?plaque, that is non flow limiting. ?  ?4. CAD-RADS 1. Mild non-obstructive CAD (0-24%). Consider ?non-atherosclerotic causes of chest pain. Consider preventive ?therapy and risk factor modification. ? ?Recent Labs: ?09/07/2020: ALT 18; BUN 19; Creatinine 1.04; Potassium 4.7; Sodium 139 ?04/09/2021: Hemoglobin 12.5; Platelet Count 184  ? ?Recent Lipid Panel ?No results found for: CHOL, TRIG, HDL, CHOLHDL, VLDL, LDLCALC, LDLDIRECT ? ?Physical Exam:   ?VS:  BP 102/64 (BP Location: Left Arm, Patient Position: Sitting, Cuff  Size: Normal)   Pulse 63   Ht '5\' 10"'$  (1.778 m)   Wt 191 lb 6.4 oz (86.8 kg)   SpO2 98%   BMI 27.46 kg/m?    ?Wt Readings from Last 3 Encounters:  ?05/19/21 191 lb 6.4 oz (86.8 kg)  ?04/09/21 196 lb 9.6 oz (89.2 kg)  ?12/11/20 184 lb 12.8 oz (83.8 kg)  ?  ?General: Well nourished, well developed, in no acute distress ?Head: Atraumatic, normal size  ?Eyes: PEERLA, EOMI  ?Neck: Supple, no JVD ?Endocrine: No thryomegaly ?Cardiac: Normal S1, S2; RRR; no murmurs, rubs, or gallops ?Lungs: Clear to auscultation bilaterally, no wheezing, rhonchi or rales  ?Abd: Soft, nontender, no  hepatomegaly  ?Ext: No edema, pulses 2+ ?Musculoskeletal: No deformities, BUE and BLE strength normal and equal ?Skin: Warm and dry, no rashes   ?Neuro: Alert and oriented to person, place, time, and situation, CNII-XII grossly intact, no focal deficits  ?Psych: Normal mood and affect  ? ?ASSESSMENT:   ?Teagon Kron is a 76 y.o. male who presents for the following: ?1. Shortness of breath   ?2. Neck pain   ?3. Agatston coronary artery calcium score less than 100   ?4. Mixed hyperlipidemia   ? ? ?PLAN:   ?1. Shortness of breath ?2. Neck pain ?-He reports years of exertional neck pain.  Occurs with heavy activity.  Does not occur with light or activity.  Can still exercise up to 30 to 40 miles on a bike.  He does have minimal CAD on a coronary CTA from 2 years ago.  He is quite healthy otherwise.  Unclear etiology of his symptoms.  To me it sounds musculoskeletal.  He does arrive with a cardiologist who believes he could have microvascular dysfunction.  We did discuss that cardiac PET is only way to know if he has this diagnosis.  I have recommended a cardiac PET for exclusion of this diagnosis.  He is okay to proceed with this.  Procedure was explained.  We will see him and get this test done. ? ?Shared Decision Making/Informed Consent ?The risks [chest pain, shortness of breath, cardiac arrhythmias, dizziness, blood pressure fluctuations,  myocardial infarction, stroke/transient ischemic attack, nausea, vomiting, allergic reaction, radiation exposure, metallic taste sensation and life-threatening complications (estimated to be 1 in 10,000)], benefits

## 2021-05-19 ENCOUNTER — Ambulatory Visit (INDEPENDENT_AMBULATORY_CARE_PROVIDER_SITE_OTHER): Payer: Medicare HMO | Admitting: Cardiovascular Disease

## 2021-05-19 ENCOUNTER — Encounter: Payer: Self-pay | Admitting: Cardiovascular Disease

## 2021-05-19 VITALS — BP 102/64 | HR 63 | Ht 70.0 in | Wt 191.4 lb

## 2021-05-19 DIAGNOSIS — R0602 Shortness of breath: Secondary | ICD-10-CM

## 2021-05-19 DIAGNOSIS — R931 Abnormal findings on diagnostic imaging of heart and coronary circulation: Secondary | ICD-10-CM | POA: Diagnosis not present

## 2021-05-19 DIAGNOSIS — E782 Mixed hyperlipidemia: Secondary | ICD-10-CM

## 2021-05-19 DIAGNOSIS — M542 Cervicalgia: Secondary | ICD-10-CM

## 2021-05-19 NOTE — Patient Instructions (Signed)
Medication Instructions:  ?The current medical regimen is effective;  continue present plan and medications. ? ?*If you need a refill on your cardiac medications before your next appointment, please call your pharmacy* ? ?Testing/Procedures: ?CARDIAC PET- they will call you to schedule this. ? ? ?Follow-Up: ?At [facility], you and your health needs are our priority.  As part of our continuing mission to provide you with exceptional heart care, we have created designated Provider Care Teams.  These Care Teams include your primary Cardiologist (physician) and Advanced Practice Providers (APPs -  Physician Assistants and Nurse Practitioners) who all work together to provide you with the care you need, when you need it. ? ?We recommend signing up for the patient portal called "MyChart".  Sign up information is provided on this After Visit Summary.  MyChart is used to connect with patients for Virtual Visits (Telemedicine).  Patients are able to view lab/test results, encounter notes, upcoming appointments, etc.  Non-urgent messages can be sent to your provider as well.   ?To learn more about what you can do with MyChart, go to [email or web address].   ? ?Your next appointment:   ?As needed ? ?The format for your next appointment:   ?In Person ? ?Provider:   ?Eleonore Chiquito, MD  ? ? ?Other Instructions ?How to Prepare for Your Cardiac PET/CT Stress Test: ? ?1. Please do not take these medications before your test:  ? ?Medications that may interfere with the cardiac pharmacological stress agent (ex. nitrates or beta-blockers) the day of the exam. ?Theophylline containing medications for 12 hours. ?Dipyridamole 48 hours prior to the test. ?Your remaining medications may be taken with water. ? ?2. Nothing to eat or drink, except water, 3 hours prior to arrival time.   ?NO caffeine/decaffeinated products, or chocolate 12 hours prior to arrival. ? ?3. NO perfume, cologne or lotion ? ?4. Total time is 1 to 2 hours; you  may want to bring reading material for the waiting time. ? ?5. Please report to Admitting at the [place] Entrance 60 minutes early for your test. ? [address] ? [place], [place] [id number] ? ?Diabetic Preparation:  ?Hold oral medications. ?You may take NPH and Lantus insulin. ?Do not take Humalog or Humulin R (Regular Insulin) the day of your test. ?Check blood sugars prior to leaving the house. ?If able to eat breakfast prior to 3 hour fasting, you may take all medications, including your insulin, ?Do not worry if you miss your breakfast dose of insulin - start at your next meal. ? ?IF YOU THINK YOU MAY BE PREGNANT, OR ARE NURSING PLEASE INFORM THE TECHNOLOGIST. ? ?In preparation for your appointment, medication and supplies will be purchased.  Appointment availability is limited, so if you need to cancel or reschedule, please call the Radiology Department at [phone]  24 hours in advance to avoid a cancellation fee of $100.00 ? ?What to Expect After you Arrive: ? ?Once you arrive and check in for your appointment, you will be taken to a preparation room within the Radiology Department.  A technologist or Nurse will obtain your medical history, verify that you are correctly prepped for the exam, and explain the procedure.  Afterwards,  an IV will be started in your arm and electrodes will be placed on your skin for EKG monitoring during the stress portion of the exam. Then you will be escorted to the PET/CT scanner.  There, staff will get you positioned on the scanner and obtain a  blood pressure and EKG.  During the exam, you will continue to be connected to the EKG and blood pressure machines.  A small, safe amount of a radioactive tracer will be injected in your IV to obtain a series of pictures of your heart along with an injection of a stress agent.   ? ?After your Exam: ? ?It is recommended that you eat a meal and drink a caffeinated beverage to counter act any effects of the stress  agent.  Drink plenty of fluids for the remainder of the day and urinate frequently for the first couple of hours after the exam.  Your doctor will inform you of your test results within 7-10 business days. ? ?For questions about your test or how to prepare for your test, please call: ?Marchia Bond, Cardiac Imaging Nurse Navigator  ?Gordy Clement, Cardiac Imaging Nurse Navigator ?Office: [phone] ? ? ?Important Information About Sugar ? ? ? ? ? ? ?

## 2021-06-14 ENCOUNTER — Telehealth (HOSPITAL_COMMUNITY): Payer: Self-pay | Admitting: *Deleted

## 2021-06-14 NOTE — Telephone Encounter (Signed)
Reaching out to patient to offer assistance regarding upcoming cardiac imaging study; pt verbalizes understanding of appt date/time, parking situation and where to check in, pre-test NPO status, and verified current allergies; name and call back number provided for further questions should they arise ? ?Gordy Clement RN Navigator Cardiac Imaging ?[facility] ?[phone] office ?[phone] cell ? ?Patient informed to not consume caffeine after 8pm this evening. ?

## 2021-06-15 ENCOUNTER — Encounter (HOSPITAL_COMMUNITY)
Admission: RE | Admit: 2021-06-15 | Discharge: 2021-06-15 | Disposition: A | Discharge status: Home / Self Care | Payer: Medicare HMO | Source: Ambulatory Visit | Attending: Cardiovascular Disease | Admitting: Cardiovascular Disease

## 2021-06-15 DIAGNOSIS — R0602 Shortness of breath: Secondary | ICD-10-CM | POA: Diagnosis present

## 2021-06-15 LAB — NM PET CT CARDIAC PERFUSION MULTI W/ABSOLUTE BLOODFLOW
LV dias vol: 132 mL (ref 62–150)
LV sys vol: 58 mL
MBFR: 2.24
Nuc Rest EF: 48 %
Nuc Stress EF: 56 %
Peak HR: 73 {beats}/min
Rest HR: 65 {beats}/min
Rest MBF: 0.84 ml/g/min
Rest Nuclear Isotope Dose: 22.7 mCi
Rest perfusion cavity size (mL): 123 mL
ST Depression (mm): 0 mm
Stress MBF: 1.88 ml/g/min
Stress Nuclear Isotope Dose: 22.7 mCi
Stress perfusion cavity size (mL): 132 mL
TID: 0.94

## 2021-06-15 MED ORDER — RUBIDIUM RB82 GENERATOR (RUBYFILL)
22.6600 | PACK | Freq: Once | INTRAVENOUS | Status: AC
  Administered 2021-06-15: 22.66 via INTRAVENOUS

## 2021-06-15 MED ORDER — REGADENOSON 0.4 MG/5ML IV SOLN
INTRAVENOUS | Status: AC
  Administered 2021-06-15: 0.4 mg
  Filled 2021-06-15: qty 5

## 2021-06-15 MED ORDER — RUBIDIUM RB82 GENERATOR (RUBYFILL)
22.6500 | PACK | Freq: Once | INTRAVENOUS | Status: AC
  Administered 2021-06-15: 22.65 via INTRAVENOUS

## 2021-07-13 ENCOUNTER — Ambulatory Visit (INDEPENDENT_AMBULATORY_CARE_PROVIDER_SITE_OTHER): Payer: Medicare HMO

## 2021-07-13 DIAGNOSIS — Z95810 Presence of automatic (implantable) cardiac defibrillator: Secondary | ICD-10-CM | POA: Diagnosis not present

## 2021-07-13 LAB — CUP PACEART REMOTE DEVICE CHECK
Battery Remaining Longevity: 180 mo
Battery Remaining Percentage: 100 %
Brady Statistic RA Percent Paced: 1 %
Brady Statistic RV Percent Paced: 0 %
Date Time Interrogation Session: 20230613041100
Implantable Lead Implant Date: 20220613
Implantable Lead Implant Date: 20220613
Implantable Lead Location: 753859
Implantable Lead Location: 753860
Implantable Lead Model: 7841
Implantable Lead Model: 7842
Implantable Lead Serial Number: 1074164
Implantable Lead Serial Number: 1156308
Implantable Pulse Generator Implant Date: 20220613
Lead Channel Impedance Value: 581 Ohm
Lead Channel Impedance Value: 653 Ohm
Lead Channel Pacing Threshold Amplitude: 0.6 V
Lead Channel Pacing Threshold Amplitude: 0.8 V
Lead Channel Pacing Threshold Pulse Width: 0.4 ms
Lead Channel Pacing Threshold Pulse Width: 0.4 ms
Lead Channel Setting Pacing Amplitude: 2 V
Lead Channel Setting Pacing Amplitude: 2.5 V
Lead Channel Setting Pacing Pulse Width: 0.4 ms
Lead Channel Setting Sensing Sensitivity: 2.5 mV
Pulse Gen Serial Number: 994472

## 2021-07-16 DIAGNOSIS — Z8546 Personal history of malignant neoplasm of prostate: Secondary | ICD-10-CM | POA: Diagnosis not present

## 2021-07-23 DIAGNOSIS — Z8546 Personal history of malignant neoplasm of prostate: Secondary | ICD-10-CM | POA: Diagnosis not present

## 2021-07-23 DIAGNOSIS — E349 Endocrine disorder, unspecified: Secondary | ICD-10-CM | POA: Diagnosis not present

## 2021-07-27 DIAGNOSIS — Z950 Presence of cardiac pacemaker: Secondary | ICD-10-CM | POA: Insufficient documentation

## 2021-07-27 DIAGNOSIS — R55 Syncope and collapse: Secondary | ICD-10-CM | POA: Insufficient documentation

## 2021-07-28 ENCOUNTER — Encounter: Payer: Self-pay | Admitting: Internal Medicine

## 2021-07-28 ENCOUNTER — Ambulatory Visit: Payer: Medicare HMO | Admitting: Internal Medicine

## 2021-07-28 VITALS — BP 104/62 | HR 67 | Ht 70.0 in | Wt 186.2 lb

## 2021-07-28 DIAGNOSIS — I480 Paroxysmal atrial fibrillation: Secondary | ICD-10-CM | POA: Diagnosis not present

## 2021-07-28 DIAGNOSIS — R002 Palpitations: Secondary | ICD-10-CM

## 2021-07-28 DIAGNOSIS — Z950 Presence of cardiac pacemaker: Secondary | ICD-10-CM | POA: Diagnosis not present

## 2021-07-28 DIAGNOSIS — R55 Syncope and collapse: Secondary | ICD-10-CM | POA: Diagnosis not present

## 2021-07-28 DIAGNOSIS — I453 Trifascicular block: Secondary | ICD-10-CM

## 2021-07-28 MED ORDER — PROPRANOLOL HCL 10 MG PO TABS
10.0000 mg | ORAL_TABLET | ORAL | 0 refills | Status: DC | PRN
Start: 2021-07-28 — End: 2021-08-20

## 2021-07-28 NOTE — Patient Instructions (Signed)
Medication Instructions:  Your physician has recommended you make the following change in your medication:  ** Begin Propranolol '10mg'$  - 1 tablet as needed   *If you need a refill on your cardiac medications before your next appointment, please call your pharmacy*   Lab Work: None ordered.  If you have labs (blood work) drawn today and your tests are completely normal, you will receive your results only by: [place] (if you have MyChart) OR A paper copy in the mail If you have any lab test that is abnormal or we need to change your treatment, we will call you to review the results.   Testing/Procedures: None ordered.    Follow-Up: At [facility], you and your health needs are our priority.  As part of our continuing mission to provide you with exceptional heart care, we have created designated Provider Care Teams.  These Care Teams include your primary Cardiologist (physician) and Advanced Practice Providers (APPs -  Physician Assistants and Nurse Practitioners) who all work together to provide you with the care you need, when you need it.  We recommend signing up for the patient portal called "MyChart".  Sign up information is provided on this After Visit Summary.  MyChart is used to connect with patients for Virtual Visits (Telemedicine).  Patients are able to view lab/test results, encounter notes, upcoming appointments, etc.  Non-urgent messages can be sent to your provider as well.   To learn more about what you can do with MyChart, go to [email or web address].    Your next appointment:   12 months with Dr Caryl Comes  Important Information About Sugar

## 2021-07-28 NOTE — Progress Notes (Signed)
Patient Care Team: Deland Pretty, MD as PCP - General (Internal Medicine)   HPI  Kevin Castro is a 76 y.o. 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 2022 syncope with abrupt decrease in heart rate (140-close to 0) >> with underlying trifascicular block>> pacemaker [facility] [employer] 6/22.   11/22 seen after called by  BB, riding with pt and had persistent tachycardia which on transmission was aflutter 2:1 AVblock.  Self terminating after about 4.5 hrs.    He has noted that with Trail running more commonly but right and also to some degree that his heart rate goes very quickly to a rapid rate and causes him to have to slow down.  Heart rate excursion demonstrates continuous heart rate histogram for the most part.   He has a history of prostate cancer.  His testosterone is improving.  He has discontinued his statin and his aspirin at this time.  DATE TEST EF   7/21 CTA   % Ca Score 0 RCA 0-25%   8/22 CTA  CaScore 10    Date Cr K Hgb LDL  8/22 1.04 4.7 12.4<<13.2    3/23   12.5<<11.9    Ferritin 102  FESat 26%. Myeloma panel & B12 normal  Thromboembolic risk factors ( age -76) for a CHADSVASc Score of 1       Past Medical History:  Diagnosis Date   Atrial tachycardia (HCC)    Paroxysmal atrial fibrillation (HCC)    Prostate CA (HCC)    Sinus bradycardia    Trifascicular block     Past Surgical History:  Procedure Laterality Date   DOBUTAMINE STRESS ECHO     ELECTROPHYSIOLOGIC STUDY     ELECTROPHYSIOLOGIC STUDY N/A 10/16/2015   Procedure: Atrial Fibrillation Ablation;  Surgeon: Thompson Grayer, MD;  Location: [facility];  Service: Cardiovascular;  Laterality: N/A;   PACEMAKER IMPLANT N/A 07/13/2020   Procedure: PACEMAKER IMPLANT;  Surgeon: Deboraha Sprang, MD;  Location: [facility];  Service: Cardiovascular;  Laterality: N/A;    Current  Outpatient Medications  Medication Sig Dispense Refill   Glucosamine-Chondroit-Vit C-Mn (GLUCOSAMINE 1500 COMPLEX PO) Take 1,500 mg by mouth daily. 1500 mg / 1200 mg     Multiple Vitamin (MULTI-VITAMIN) tablet Take 1 tablet by mouth daily.     naproxen sodium (ALEVE) 220 MG tablet Take 220-440 mg by mouth daily as needed (pain).     Omega-3 Fatty Acids (FISH OIL) 1000 MG CAPS Take 1,000 mg by mouth daily.     rosuvastatin (CRESTOR) 5 MG tablet Take 5 mg by mouth daily.     tamsulosin (FLOMAX) 0.4 MG CAPS capsule 0.4 mg daily.     verapamil (CALAN) 40 MG tablet Take 1 tablet (40 mg) by mouth as needed for tachycardia, can repeat in 30 minutes x 1 tablet (40 mg) if needed 30 tablet 6   No current facility-administered medications for this visit.    Allergies  Allergen Reactions   Penicillins Rash    Rash/blisters Has patient had a PCN reaction causing immediate rash, facial/tongue/throat swelling, SOB or lightheadedness with hypotension: Yes Has patient had a PCN reaction causing severe rash involving mucus membranes or skin necrosis: No Has patient had a PCN reaction that required hospitalization No Has patient had a PCN reaction occurring within the last 10 years: No If all of the above answers are "  NO", then may proceed with Cephalosporin use.       Review of Systems negative except from HPI and PMH BP 104/62   Pulse 67   Ht '5\' 10"'$  (1.778 m)   Wt 186 lb 3.2 oz (84.5 kg)   SpO2 97%   BMI 26.72 kg/m  Well developed and well nourished in no acute distress HENT normal Neck supple with JVP-flat Clear Device pocket well healed; without hematoma or erythema.  There is no tethering  Regular rate and rhythm, no  murmur Abd-soft with active BS No Clubbing cyanosis  edema Skin-warm and dry A & Oriented  Grossly normal sensory and motor function  ECG sinus at 67 Intervals 23/14/45 Axis left -45 PACs  Assessment and  Plan  Trifascicular block  Syncope  Pacemaker [employer] device    Ca Score now 10  Palpitations-- frequent atrial ectopy   Atrial fibrillation/flutter prior ablation JA  Recurrent Atrial flutter Dur< 6 hrs // SCAF    Tachypalpitations with running which are limiting.  Histograms suggest that this is sinus.  We will try him on low-dose propranolol.  We discussed the theoretics of this, the physiology makes sense.  I am not quite sure why his heart rate might be more right shifted than it has but hopefully this will attenuate and he will be able to exercise more easily.  He has had some atrial arrhythmia, atrial burden is considerably less and there does not seem to be a correlation between the little bit of atrial arrhythmia that we see on his device interrogation as opposed to the frequency of his activities  No longer on ASA broached this with him and would like to hold off for now.  He is no longer on his statin as he thinks that it may impede the recovery of his testosterone following his prostate cancer.  No further syncope.

## 2021-07-29 DIAGNOSIS — D649 Anemia, unspecified: Secondary | ICD-10-CM | POA: Diagnosis not present

## 2021-07-29 DIAGNOSIS — E7800 Pure hypercholesterolemia, unspecified: Secondary | ICD-10-CM | POA: Diagnosis not present

## 2021-07-29 DIAGNOSIS — I2510 Atherosclerotic heart disease of native coronary artery without angina pectoris: Secondary | ICD-10-CM | POA: Diagnosis not present

## 2021-07-29 DIAGNOSIS — R232 Flushing: Secondary | ICD-10-CM | POA: Diagnosis not present

## 2021-07-29 DIAGNOSIS — Z125 Encounter for screening for malignant neoplasm of prostate: Secondary | ICD-10-CM | POA: Diagnosis not present

## 2021-07-30 NOTE — Addendum Note (Signed)
Addended by: Drue Novel I on: 07/30/2021 01:34 PM   Modules accepted: Orders

## 2021-08-02 DIAGNOSIS — G250 Essential tremor: Secondary | ICD-10-CM | POA: Diagnosis not present

## 2021-08-02 DIAGNOSIS — D72819 Decreased white blood cell count, unspecified: Secondary | ICD-10-CM | POA: Diagnosis not present

## 2021-08-02 DIAGNOSIS — Z0000 Encounter for general adult medical examination without abnormal findings: Secondary | ICD-10-CM | POA: Diagnosis not present

## 2021-08-02 DIAGNOSIS — I2510 Atherosclerotic heart disease of native coronary artery without angina pectoris: Secondary | ICD-10-CM | POA: Diagnosis not present

## 2021-08-02 DIAGNOSIS — S98121S Partial traumatic amputation of right great toe, sequela: Secondary | ICD-10-CM | POA: Diagnosis not present

## 2021-08-02 DIAGNOSIS — Z8546 Personal history of malignant neoplasm of prostate: Secondary | ICD-10-CM | POA: Diagnosis not present

## 2021-08-19 ENCOUNTER — Other Ambulatory Visit: Payer: Self-pay | Admitting: Internal Medicine

## 2021-08-25 DIAGNOSIS — Z13820 Encounter for screening for osteoporosis: Secondary | ICD-10-CM | POA: Diagnosis not present

## 2021-08-25 DIAGNOSIS — Z9079 Acquired absence of other genital organ(s): Secondary | ICD-10-CM | POA: Diagnosis not present

## 2021-08-25 DIAGNOSIS — Z8546 Personal history of malignant neoplasm of prostate: Secondary | ICD-10-CM | POA: Diagnosis not present

## 2021-10-04 IMAGING — NM NM BONE WHOLE BODY
2 series · 2 of 2 positions shown · non-contrast
Comparison: None

Radiographic correlation: 11/15/2018

CLINICAL DATA: Prostate cancer, PSA =

EXAM:
NUCLEAR MEDICINE WHOLE BODY BONE SCAN
TECHNIQUE: Whole body anterior and posterior images were obtained approximately
3 hours after intravenous injection of radiopharmaceutical.
RADIOPHARMACEUTICALS:  20.9 mCi Eechnetium-ZZm MDP IV

[Series 1: whole body · 2.66mm/px · 1 of 1 slices shown (1 of 2)]
[im 1/1]
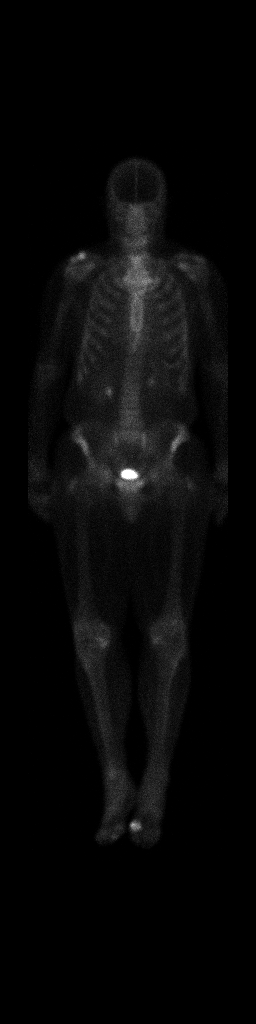

[Series 1: whole body · 2.66mm/px · 1 of 1 slices shown (2 of 2)]
[im 1/1]
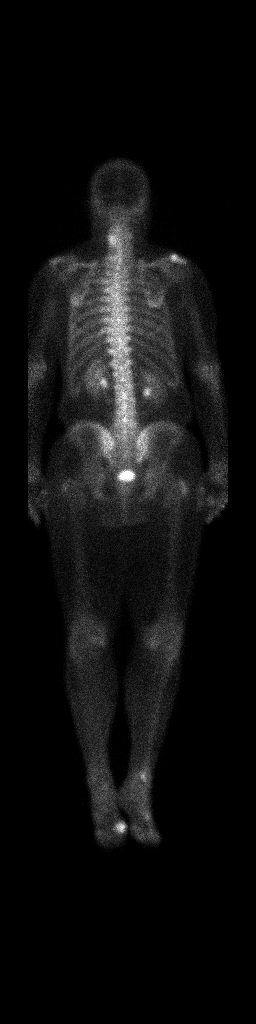

[2 of 2 positions shown; findings below may reference images not displayed]

FINDINGS: Uptake at RIGHT AC joint, typically degenerative.

However, slightly more pronounced uptake of tracer is seen at the
posterior RIGHT acromion, nonspecific, cannot exclude metastatic
disease.

Uptake at the LEFT lateral aspect of the cervical spine, typically
degenerative.

Uptake at sternoclavicular joints, knees, RIGHT ankle, LEFT first
MTP joint, typically degenerative.

No additional worrisome sites of abnormal tracer localization are
seen.

Minimal increased tracer localization at the lateral aspects of the
greater trochanters bilaterally, could be related to trauma or
bursitis.

Expected urinary tract and soft tissue distribution of tracer.
IMPRESSION: Uptake at the posterior RIGHT shoulder at the posterior acromion,
could be related to degenerative changes such as from a chronic
rotator cuff tear but metastatic disease is not excluded; dedicated
RIGHT shoulder radiographs recommended.

No other concerning sites of abnormal osseous uptake are identified.

## 2021-10-12 ENCOUNTER — Ambulatory Visit (INDEPENDENT_AMBULATORY_CARE_PROVIDER_SITE_OTHER): Payer: Medicare HMO

## 2021-10-12 DIAGNOSIS — I452 Bifascicular block: Secondary | ICD-10-CM | POA: Diagnosis not present

## 2021-10-15 ENCOUNTER — Other Ambulatory Visit: Payer: Medicare HMO

## 2021-10-15 ENCOUNTER — Ambulatory Visit: Payer: Medicare HMO | Admitting: Oncology

## 2021-10-15 LAB — CUP PACEART REMOTE DEVICE CHECK
Battery Remaining Longevity: 180 mo
Battery Remaining Percentage: 100 %
Brady Statistic RA Percent Paced: 1 %
Brady Statistic RV Percent Paced: 0 %
Date Time Interrogation Session: 20230912041000
Implantable Lead Implant Date: 20220613
Implantable Lead Implant Date: 20220613
Implantable Lead Location: 753859
Implantable Lead Location: 753860
Implantable Lead Model: 7841
Implantable Lead Model: 7842
Implantable Lead Serial Number: 1074164
Implantable Lead Serial Number: 1156308
Implantable Pulse Generator Implant Date: 20220613
Lead Channel Impedance Value: 562 Ohm
Lead Channel Impedance Value: 630 Ohm
Lead Channel Pacing Threshold Amplitude: 0.6 V
Lead Channel Pacing Threshold Amplitude: 0.7 V
Lead Channel Pacing Threshold Pulse Width: 0.4 ms
Lead Channel Pacing Threshold Pulse Width: 0.4 ms
Lead Channel Setting Pacing Amplitude: 2 V
Lead Channel Setting Pacing Amplitude: 2 V
Lead Channel Setting Pacing Pulse Width: 0.4 ms
Lead Channel Setting Sensing Sensitivity: 2.5 mV
Pulse Gen Serial Number: 994472

## 2021-10-20 DIAGNOSIS — C61 Malignant neoplasm of prostate: Secondary | ICD-10-CM | POA: Diagnosis not present

## 2021-10-28 NOTE — Progress Notes (Signed)
Remote pacemaker transmission.   

## 2021-11-05 ENCOUNTER — Encounter: Payer: Self-pay | Admitting: Oncology

## 2021-11-05 ENCOUNTER — Inpatient Hospital Stay: Payer: Medicare HMO | Attending: Oncology

## 2021-11-05 ENCOUNTER — Inpatient Hospital Stay: Payer: Medicare HMO | Admitting: Oncology

## 2021-11-05 VITALS — BP 112/74 | HR 63 | Temp 98.2°F | Resp 18 | Ht 70.0 in | Wt 185.6 lb

## 2021-11-05 DIAGNOSIS — D649 Anemia, unspecified: Secondary | ICD-10-CM

## 2021-11-05 DIAGNOSIS — Z8546 Personal history of malignant neoplasm of prostate: Secondary | ICD-10-CM | POA: Insufficient documentation

## 2021-11-05 DIAGNOSIS — I4891 Unspecified atrial fibrillation: Secondary | ICD-10-CM | POA: Diagnosis not present

## 2021-11-05 DIAGNOSIS — Z950 Presence of cardiac pacemaker: Secondary | ICD-10-CM | POA: Diagnosis not present

## 2021-11-05 DIAGNOSIS — I453 Trifascicular block: Secondary | ICD-10-CM | POA: Diagnosis not present

## 2021-11-05 LAB — CBC WITH DIFFERENTIAL (CANCER CENTER ONLY)
Abs Immature Granulocytes: 0 10*3/uL (ref 0.00–0.07)
Basophils Absolute: 0 10*3/uL (ref 0.0–0.1)
Basophils Relative: 1 %
Eosinophils Absolute: 0.2 10*3/uL (ref 0.0–0.5)
Eosinophils Relative: 6 %
HCT: 35.9 % — ABNORMAL LOW (ref 39.0–52.0)
Hemoglobin: 12.4 g/dL — ABNORMAL LOW (ref 13.0–17.0)
Immature Granulocytes: 0 %
Lymphocytes Relative: 22 %
Lymphs Abs: 0.8 10*3/uL (ref 0.7–4.0)
MCH: 32.9 pg (ref 26.0–34.0)
MCHC: 34.5 g/dL (ref 30.0–36.0)
MCV: 95.2 fL (ref 80.0–100.0)
Monocytes Absolute: 0.4 10*3/uL (ref 0.1–1.0)
Monocytes Relative: 11 %
Neutro Abs: 2.1 10*3/uL (ref 1.7–7.7)
Neutrophils Relative %: 60 %
Platelet Count: 162 10*3/uL (ref 150–400)
RBC: 3.77 MIL/uL — ABNORMAL LOW (ref 4.22–5.81)
RDW: 13.2 % (ref 11.5–15.5)
WBC Count: 3.5 10*3/uL — ABNORMAL LOW (ref 4.0–10.5)
nRBC: 0 % (ref 0.0–0.2)

## 2021-11-05 NOTE — Progress Notes (Signed)
  [facility] OFFICE PROGRESS NOTE   Diagnosis: Anemia  INTERVAL HISTORY:   Kevin Castro returns as scheduled.  He feels well.  Good appetite and energy level.  He is exercising.  He has occasional bleeding with a bowel movement.  He relates this to radiation proctitis.  He had a colonoscopy earlier this year.  He has hot flashes at night.  He reports that testosterone level remains low.  He is scheduled to see Dr. Diona Fanti in December.  Objective:  Vital signs in last 24 hours:  Blood pressure 112/74, pulse 63, temperature 98.2 F (36.8 C), temperature source Oral, resp. rate 18, height '5\' 10"'$  (1.778 m), weight 185 lb 9.6 oz (84.2 kg), SpO2 100 %.    Lymphatics: No cervical, supraclavicular, axillary, or inguinal nodes Resp: Lungs clear bilaterally Cardio: Irregular GI: No hepatosplenomegaly Vascular: No leg edema   Lab Results:  Lab Results  Component Value Date   WBC 3.5 (L) 11/05/2021   HGB 12.4 (L) 11/05/2021   HCT 35.9 (L) 11/05/2021   MCV 95.2 11/05/2021   PLT 162 11/05/2021   NEUTROABS 2.1 11/05/2021    CMP  Lab Results  Component Value Date   NA 139 09/07/2020   K 4.7 09/07/2020   CL 104 09/07/2020   CO2 30 09/07/2020   GLUCOSE 97 09/07/2020   BUN 19 09/07/2020   CREATININE 1.04 09/07/2020   CALCIUM 10.0 09/07/2020   PROT 6.9 09/07/2020   ALBUMIN 4.1 09/07/2020   AST 27 09/07/2020   ALT 18 09/07/2020   ALKPHOS 49 09/07/2020   BILITOT 0.9 09/07/2020   GFRNONAA >60 09/07/2020   GFRAA 78 08/21/2019    Medications: I have reviewed the patient's current medications.   Assessment/Plan: Anemia Prostate cancer, T2a with a Gleason score of 4+4 03/11/19- upfront brachytherapy boost at [facility] Radioactive seeds were implanted into the prostate for a total of 110 Gy 04/23/19 - 05/27/19- prostate, seminal vesicles, and pelvic lymph nodes were boosted with 45 Gy in 25 fractions of 1.8 Gy, for a total dose of 155 Gy Atrial  fibrillation Trifascicular block/syncope status post pacemaker placement 07/13/2020    Disposition: Kevin Castro appears stable.  He has stable mild anemia, potentially related to treatment for prostate cancer.  He will call for symptoms of anemia.  He will return for an office visit and CBC in 9 months.  He will follow-up with gastroenterology for persistent rectal bleeding.  He is scheduled to see Dr. Diona Fanti in December.  Betsy Coder, MD  11/05/2021  8:38 AM

## 2021-11-11 ENCOUNTER — Encounter: Payer: Self-pay | Admitting: Internal Medicine

## 2021-11-11 ENCOUNTER — Ambulatory Visit: Payer: Medicare HMO | Attending: Internal Medicine | Admitting: Internal Medicine

## 2021-11-11 ENCOUNTER — Telehealth: Payer: Self-pay

## 2021-11-11 DIAGNOSIS — I484 Atypical atrial flutter: Secondary | ICD-10-CM

## 2021-11-11 NOTE — Progress Notes (Signed)
Patient seen today after he called in with exercise intolerance and transition demonstrated atrial flutter in the context of prior atrial fibrillation ablation.  He was brought into the office, ECG demonstrated atypical atrial flutter.  He underwent pace termination   HR 98   He will continue to use his cardia monitor to and is instructed that if he has more than 24 hours of atrial arrhythmia to resume his anticoagulation.  The frequency of his recurrent arrhythmias will inform decision-making regarding therapeutic intervention.  Durations 24 min

## 2021-11-11 NOTE — Telephone Encounter (Signed)
Alert received for ongoing atrial flutter.  Pt is not on [place].  Reviewed with Dr. Caryl Comes.  Will have Pt take a dose of his Xarelto 20 mg by mouth now and plan to see this afternoon to try to pace terminate.  Pt aware.

## 2021-12-03 DIAGNOSIS — E349 Endocrine disorder, unspecified: Secondary | ICD-10-CM | POA: Diagnosis not present

## 2022-01-11 ENCOUNTER — Ambulatory Visit (INDEPENDENT_AMBULATORY_CARE_PROVIDER_SITE_OTHER): Payer: Medicare HMO

## 2022-01-11 DIAGNOSIS — I453 Trifascicular block: Secondary | ICD-10-CM | POA: Diagnosis not present

## 2022-01-11 LAB — CUP PACEART REMOTE DEVICE CHECK
Battery Remaining Longevity: 174 mo
Battery Remaining Percentage: 100 %
Brady Statistic RA Percent Paced: 1 %
Brady Statistic RV Percent Paced: 0 %
Date Time Interrogation Session: 20231212041100
Implantable Lead Connection Status: 753985
Implantable Lead Connection Status: 753985
Implantable Lead Implant Date: 20220613
Implantable Lead Implant Date: 20220613
Implantable Lead Location: 753859
Implantable Lead Location: 753860
Implantable Lead Model: 7841
Implantable Lead Model: 7842
Implantable Lead Serial Number: 1074164
Implantable Lead Serial Number: 1156308
Implantable Pulse Generator Implant Date: 20220613
Lead Channel Impedance Value: 558 Ohm
Lead Channel Impedance Value: 677 Ohm
Lead Channel Setting Pacing Amplitude: 2 V
Lead Channel Setting Pacing Amplitude: 2 V
Lead Channel Setting Pacing Pulse Width: 0.4 ms
Lead Channel Setting Sensing Sensitivity: 2.5 mV
Pulse Gen Serial Number: 994472
Zone Setting Status: 755011

## 2022-01-14 DIAGNOSIS — C61 Malignant neoplasm of prostate: Secondary | ICD-10-CM | POA: Diagnosis not present

## 2022-01-21 DIAGNOSIS — R351 Nocturia: Secondary | ICD-10-CM | POA: Diagnosis not present

## 2022-01-21 DIAGNOSIS — N401 Enlarged prostate with lower urinary tract symptoms: Secondary | ICD-10-CM | POA: Diagnosis not present

## 2022-01-21 DIAGNOSIS — E349 Endocrine disorder, unspecified: Secondary | ICD-10-CM | POA: Diagnosis not present

## 2022-02-02 DIAGNOSIS — R69 Illness, unspecified: Secondary | ICD-10-CM | POA: Diagnosis not present

## 2022-02-09 NOTE — Progress Notes (Signed)
Remote pacemaker transmission.   

## 2022-02-14 ENCOUNTER — Telehealth: Payer: Self-pay

## 2022-02-14 NOTE — Telephone Encounter (Signed)
Alert received from [facility]:  Device alert Atrial Arrhythmia Burden of at least 6.0 hours in a 24 hour period. Presenting AF/AFL ongoing from 1/12 @ 21:58, controlled rates No OAC, last ov 10/12 pt instructed to  "resume [place] for atrial arrhythmia >24hrs" - route to triage  Left detailed message for Pt requesting he send an additional transmission and call device clinic.

## 2022-02-14 NOTE — Telephone Encounter (Signed)
Call back received from Pt.  Manual transmission also received. Pt was not aware of atrial fibrillation episode that occurred on January 12-13, 2024.  Pt is currently back in SR.  Advised would forward to Dr. Caryl Comes for review.  Pt does not take a daily [place].   Presenting rhythm:

## 2022-02-16 NOTE — Telephone Encounter (Signed)
I see that this episode was about 11 hrs in duration;  I should probably have a conversation ( telehealth works) for shared decision regarding anticoagulation  Thanks SK

## 2022-02-16 NOTE — Telephone Encounter (Signed)
Pt scheduled for telehealth visit to discuss afib/OAC with Dr. Caryl Comes.

## 2022-02-18 DIAGNOSIS — H2512 Age-related nuclear cataract, left eye: Secondary | ICD-10-CM | POA: Diagnosis not present

## 2022-02-21 ENCOUNTER — Ambulatory Visit: Payer: Medicare HMO | Attending: Internal Medicine | Admitting: Internal Medicine

## 2022-02-21 ENCOUNTER — Encounter: Payer: Self-pay | Admitting: Internal Medicine

## 2022-02-21 ENCOUNTER — Telehealth: Payer: Self-pay

## 2022-02-21 VITALS — BP 126/81 | HR 91 | Ht 70.0 in

## 2022-02-21 DIAGNOSIS — I453 Trifascicular block: Secondary | ICD-10-CM

## 2022-02-21 DIAGNOSIS — Z950 Presence of cardiac pacemaker: Secondary | ICD-10-CM | POA: Diagnosis not present

## 2022-02-21 DIAGNOSIS — I452 Bifascicular block: Secondary | ICD-10-CM

## 2022-02-21 DIAGNOSIS — I480 Paroxysmal atrial fibrillation: Secondary | ICD-10-CM | POA: Diagnosis not present

## 2022-02-21 NOTE — Telephone Encounter (Signed)
  Patient Consent for Virtual Visit        Kevin Castro has provided verbal consent on 02/21/2022 for a virtual visit (video or telephone).   CONSENT FOR VIRTUAL VISIT FOR:  Kevin Castro  By participating in this virtual visit I agree to the following:  I hereby voluntarily request, consent and authorize [facility] and its employed or contracted physicians, physician assistants, nurse practitioners or other licensed health care professionals (the Practitioner), to provide me with telemedicine health care services (the "Services") as deemed necessary by the treating Practitioner. I acknowledge and consent to receive the Services by the Practitioner via telemedicine. I understand that the telemedicine visit will involve communicating with the Practitioner through live audiovisual communication technology and the disclosure of certain medical information by electronic transmission. I acknowledge that I have been given the opportunity to request an in-person assessment or other available alternative prior to the telemedicine visit and am voluntarily participating in the telemedicine visit.  I understand that I have the right to withhold or withdraw my consent to the use of telemedicine in the course of my care at any time, without affecting my right to future care or treatment, and that the Practitioner or I may terminate the telemedicine visit at any time. I understand that I have the right to inspect all information obtained and/or recorded in the course of the telemedicine visit and may receive copies of available information for a reasonable fee.  I understand that some of the potential risks of receiving the Services via telemedicine include:  Delay or interruption in medical evaluation due to technological equipment failure or disruption; Information transmitted may not be sufficient (e.g. poor resolution of images) to allow for appropriate medical decision making by the Practitioner;  and/or  In rare instances, security protocols could fail, causing a breach of personal health information.  Furthermore, I acknowledge that it is my responsibility to provide information about my medical history, conditions and care that is complete and accurate to the best of my ability. I acknowledge that Practitioner's advice, recommendations, and/or decision may be based on factors not within their control, such as incomplete or inaccurate data provided by me or distortions of diagnostic images or specimens that may result from electronic transmissions. I understand that the practice of medicine is not an exact science and that Practitioner makes no warranties or guarantees regarding treatment outcomes. I acknowledge that a copy of this consent can be made available to me via my patient portal ([facility]), or I can request a printed copy by calling the office of [facility].    I understand that my insurance will be billed for this visit.   I have read or had this consent read to me. I understand the contents of this consent, which adequately explains the benefits and risks of the Services being provided via telemedicine.  I have been provided ample opportunity to ask questions regarding this consent and the Services and have had my questions answered to my satisfaction. I give my informed consent for the services to be provided through the use of telemedicine in my medical care

## 2022-02-21 NOTE — Patient Instructions (Addendum)
Medication Instructions:  Your physician recommends that you continue on your current medications as directed. Please refer to the Current Medication list given to you today.   *If you need a refill on your cardiac medications before your next appointment, please call your pharmacy*   Lab  None ordered.  If you have labs (blood work) drawn today and your tests are completely normal, you will receive your results only by: [facility] (if you have MyChart) OR A paper copy in the mail If you have any lab test that is abnormal or we need to change your treatment, we will call you to review the results.   Testing/Procedures: None ordered.    Follow-Up: At [facility], you and your health needs are our priority.  As part of our continuing mission to provide you with exceptional heart care, we have created designated Provider Care Teams.  These Care Teams include your primary Cardiologist (physician) and Advanced Practice Providers (APPs -  Physician Assistants and Nurse Practitioners) who all work together to provide you with the care you need, when you need it.  We recommend signing up for the patient portal called "MyChart".  Sign up information is provided on this After Visit Summary.  MyChart is used to connect with patients for Virtual Visits (Telemedicine).  Patients are able to view lab/test results, encounter notes, upcoming appointments, etc.  Non-urgent messages can be sent to your provider as well.   To learn more about what you can do with MyChart, go to [email or web address].    Your next appointment:   As scheduled

## 2022-02-21 NOTE — Progress Notes (Signed)
Electrophysiology TeleHealth Note   Due to national recommendations of social distancing due to COVID 19, an audio/video telehealth visit is felt to be most appropriate for this patient at this time.  See MyChart message from today for the patient's consent to telehealth for [facility].   Date:  02/21/2022   ID:  Kevin Castro, DOB 11-Apr-1945, MRN [id number]  Location: patient's home  Provider location: [address] [place]  Evaluation Performed: Initial Evaluation  PCP:  Deland Pretty, MD  Cardiologist:  None   Electrophysiologist:  None   Chief Complaint:  atrial arrhythmia  History of Present Illness:    Kevin Castro is a 77 y.o. male who presents via audio/video conferencing for a telehealth visit today with a hx of  atrial fib and PVI ablation JA 2017 cx by post procedural SOB; progressive trifasicular block and recurrent syncope and in  2022 syncope with abrupt decrease in heart rate (140-close to 0) >> with underlying trifascicular block>> pacemaker [employer] 6/22.  Now with more SCAF, duration up to 11 hrs   Had an episode of sustained atrial flutter 10/23 for which he underwent office pace termination which appears now about 24 hrs  Interval afib/flutter about 18hrs duration ( perhaps-- 2 contiguous days with about 9 hrs.)   Variable exercise tolerance >? Related to his increasingly frequent episodes of atrial arrhytmia, often occurring with a mean HR of about 110 or so suggestive of coincidental with exercise  No chest pain            He has a history of prostate cancer.  His testosterone is improving.  He has discontinued his statin and his aspirin at this time.   DATE TEST EF    7/21 CTA   % Ca Score 0 RCA 0-25%   8/22 CTA   CaScore 10    Date Cr K Hgb LDL  8/22 1.04 4.7 12.4<<13.2     3/23     12.5<<11.9       Thromboembolic risk factors ( age -60) for a CHADSVASc Score of 1          Past Medical History:  Diagnosis  Date   Atrial tachycardia    Paroxysmal atrial fibrillation (HCC)    Prostate CA (HCC)    Sinus bradycardia    Trifascicular block     Past Surgical History:  Procedure Laterality Date   DOBUTAMINE STRESS ECHO     ELECTROPHYSIOLOGIC STUDY     ELECTROPHYSIOLOGIC STUDY N/A 10/16/2015   Procedure: Atrial Fibrillation Ablation;  Surgeon: Thompson Grayer, MD;  Location: [facility];  Service: Cardiovascular;  Laterality: N/A;   PACEMAKER IMPLANT N/A 07/13/2020   Procedure: PACEMAKER IMPLANT;  Surgeon: Deboraha Sprang, MD;  Location: [facility];  Service: Cardiovascular;  Laterality: N/A;    Current Outpatient Medications  Medication Sig Dispense Refill   CLOMID 50 MG tablet Take 50 mg by mouth every 3 (three) days.     Glucosamine-Chondroit-Vit C-Mn (GLUCOSAMINE 1500 COMPLEX PO) Take 1,500 mg by mouth daily. 1500 mg / 1200 mg     Multiple Vitamin (MULTI-VITAMIN) tablet Take 1 tablet by mouth daily.     naproxen sodium (ALEVE) 220 MG tablet Take 220-440 mg by mouth daily as needed (pain).     Omega-3 Fatty Acids (FISH OIL) 1000 MG CAPS Take 1,000 mg by mouth daily.     rosuvastatin (CRESTOR) 5 MG tablet Take 5 mg by mouth daily.  tamsulosin (FLOMAX) 0.4 MG CAPS capsule 0.4 mg daily.     propranolol (INDERAL) 10 MG tablet TAKE 1 TABLET BY MOUTH AS NEEDED. (Patient not taking: Reported on 11/11/2021) 30 tablet 11   verapamil (CALAN) 40 MG tablet Take 1 tablet (40 mg) by mouth as needed for tachycardia, can repeat in 30 minutes x 1 tablet (40 mg) if needed (Patient not taking: Reported on 11/11/2021) 30 tablet 6   No current facility-administered medications for this visit.    Allergies:   Penicillins     ROS:  Please see the history of present illness.   All other systems are personally reviewed and negative.    Exam:    Vital Signs:  Ht '5\' 10"'$  (1.778 m)   BMI 26.63 kg/m     Well appearing, alert and conversant, regular work of breathing,  good skin color Eyes-  anicteric, neuro- grossly intact, skin- no apparent rash or lesions or cyanosis, mouth- oral mucosa is pink   Labs/Other Tests and Data Reviewed:    Recent Labs: 11/05/2021: Hemoglobin 12.4; Platelet Count 162   Wt Readings from Last 3 Encounters:  11/05/21 185 lb 9.6 oz (84.2 kg)  07/28/21 186 lb 3.2 oz (84.5 kg)  05/19/21 191 lb 6.4 oz (86.8 kg)     Other studies personally reviewed: Additional studies/ records that were reviewed today include:(As above)   Review of the above records today demonstrates: (As above) (See Below)  Prior radiographs:      Pacemaker tracings reviewed together , the tracings reveal atraial flutter with CVR increasing frequent      ASSESSMENT & PLAN:   Trifascicular block   Syncope   Pacemaker [employer] device     Ca Score now 10   Palpitations-- frequent atrial ectopy    Atrial fibrillation/flutter prior ablation JA   Recurrent Atrial flutter Dur > 6 hrs maybe 24 hrs // SCAF   Lengthy discussion re anticoagulation   given CHADSVAS of 1 and SCAF at that ( mostly) not clear indication for anticoagulation ; with his activities in the woods running and biking he is disinclined to anticoagulation    we will hold off.  He is aware that at 77 the paradigm changes slightly, but continuously and not dichotomously  Also discussed the role of catheter ablation for post PVI flutter.  If it turns out there is a strong correlation between AFlut events and exercise intolerance hw would like to consider this, otherwise not  Continue current meds   COVID 19 screen The patient denies symptoms of COVID 19 at this time.  The importance of social distancing was discussed today.  Follow-up:  As Scheduled but I told him I would call him after I had a chance to review in office. Ie next week, his most recent remote  Next remote: As Scheduled   Current medicines are reviewed at length with the patient today.   The patient does not have concerns  regarding his medicines.  The following changes were made today:  none  Labs/ tests ordered today include:  No orders of the defined types were placed in this encounter.     Patient Risk:  after full review of this patients clinical status, I feel that they are at moderate risk at this time.  Today, I have spent 38 minutes with the patient with telehealth technology discussing (As above)  .    Signed, Virl Axe, MD  02/21/2022 8:36 AM     [facility]  [address] [place] [address] [phone] (office) [phone] (fax)

## 2022-02-21 NOTE — Telephone Encounter (Deleted)
Patient Consent for Virtual Visit   { TIP  Anything in RED or BLUE will delete when you sign your note. There is no need to delete it or select it by pressing F2.   Please read everything in BLUE to the patient.        :[id number]} { PLEASE READ THE FOLLOWING TO THE PATIENT. Then, scroll to the question in red below. If the patient has questions about consent, refer to and read the consent below,    CONSENT FOR VIRTUAL VISIT.  Mr. [name], you are scheduled for a virtual visit with your provider today.  Just as we do with appointments in the office, we must obtain your consent to participate.  Your consent will be active for this visit and any virtual visit you may have with one of our providers in the next 365 days.  If you have a MyChart account, I can also send a copy of this consent to you electronically.  All virtual visits are billed to your insurance company just like a traditional visit in the office.  As this is a virtual visit, video technology does not allow for your provider to perform a traditional examination.  This may limit your provider's ability to fully assess your condition.  If your provider identifies any concerns that need to be evaluated in person or the need to arrange testing such as labs, EKG, etc, we will make arrangements to do so.  Although advances in technology are sophisticated, we cannot ensure that it will always work on either your end or our end.  If the connection with a video visit is poor, we may have to switch to a telephone visit.  With either a video or telephone visit, we are not always able to ensure that we have a secure connection.   I need to obtain your verbal consent now.   Are you willing to proceed with your visit today?        :[id number]}  {  Did the patient verbally consent to the visit?  Place your cursor on the next line. Then, press F2 or Fn plus F2 to select the list below.  Then, choose YES or NO.  :[id number]}   {Click here.  Press F2  and choose YES or NO                  :[id number]}   CONSENT FOR VIRTUAL VISIT FOR:  Kevin Castro  By participating in this virtual visit I agree to the following:  I hereby voluntarily request, consent and authorize [facility] and its employed or contracted physicians, physician assistants, nurse practitioners or other licensed health care professionals (the Practitioner), to provide me with telemedicine health care services (the "Services") as deemed necessary by the treating Practitioner. I acknowledge and consent to receive the Services by the Practitioner via telemedicine. I understand that the telemedicine visit will involve communicating with the Practitioner through live audiovisual communication technology and the disclosure of certain medical information by electronic transmission. I acknowledge that I have been given the opportunity to request an in-person assessment or other available alternative prior to the telemedicine visit and am voluntarily participating in the telemedicine visit.  I understand that I have the right to withhold or withdraw my consent to the use of telemedicine in the course of my care at any time, without affecting my right to future care or treatment, and that the Practitioner or I may terminate the telemedicine visit at any time. I  understand that I have the right to inspect all information obtained and/or recorded in the course of the telemedicine visit and may receive copies of available information for a reasonable fee.  I understand that some of the potential risks of receiving the Services via telemedicine include:  Delay or interruption in medical evaluation due to technological equipment failure or disruption; Information transmitted may not be sufficient (e.g. poor resolution of images) to allow for appropriate medical decision making by the Practitioner; and/or  In rare instances, security protocols could fail, causing a breach of personal health  information.  Furthermore, I acknowledge that it is my responsibility to provide information about my medical history, conditions and care that is complete and accurate to the best of my ability. I acknowledge that Practitioner's advice, recommendations, and/or decision may be based on factors not within their control, such as incomplete or inaccurate data provided by me or distortions of diagnostic images or specimens that may result from electronic transmissions. I understand that the practice of medicine is not an exact science and that Practitioner makes no warranties or guarantees regarding treatment outcomes. I acknowledge that a copy of this consent can be made available to me via my patient portal ([facility]), or I can request a printed copy by calling the office of [name].    I understand that my insurance will be billed for this visit.   I have read or had this consent read to me. I understand the contents of this consent, which adequately explains the benefits and risks of the Services being provided via telemedicine.  I have been provided ample opportunity to ask questions regarding this consent and the Services and have had my questions answered to my satisfaction. I give my informed consent for the services to be provided through the use of telemedicine in my medical care

## 2022-03-01 DIAGNOSIS — H52202 Unspecified astigmatism, left eye: Secondary | ICD-10-CM | POA: Diagnosis not present

## 2022-03-01 DIAGNOSIS — Z961 Presence of intraocular lens: Secondary | ICD-10-CM | POA: Diagnosis not present

## 2022-03-01 DIAGNOSIS — H2512 Age-related nuclear cataract, left eye: Secondary | ICD-10-CM | POA: Diagnosis not present

## 2022-03-01 DIAGNOSIS — H269 Unspecified cataract: Secondary | ICD-10-CM | POA: Diagnosis not present

## 2022-03-10 ENCOUNTER — Telehealth: Payer: Self-pay

## 2022-03-10 MED ORDER — RIVAROXABAN 20 MG PO TABS: 20.0000 mg | ORAL_TABLET | Freq: Every day | ORAL | 11 refills | Status: DC

## 2022-03-10 NOTE — Telephone Encounter (Signed)
Alert received from [facility]:  Alert remote reviewed. Normal device function.   Ongoing AF since 03/09/2022, on [facility] according to previous report, AF burden is 2% of the time, not always good ventricular rate control, heart rates 100-120 bpm.  Sent to triage for alert of AF greater than 6 hours. Next remote 04/12/2022.  Spoke with Pt.  Has not felt well yesterday and today.  Was running yesterday afternoon around 5:00 pm when he noticed his heart rate jumped to 200 BPM per his watch.  This correlates with SVT noted around this same time.  Pt sent an additional remote report and remains out of rhythm.   Advised would discuss with Dr. Caryl Comes today and let him know recommendations.      V rate during AT/AF last 118 days:

## 2022-03-10 NOTE — Telephone Encounter (Signed)
Discussed with Dr. Caryl Comes.  Episodes called SVT by device appear to be atrial flutter with non-conducted P waves.  Transmission sent this morning upon request appears to be atrial fibrillation.  Per Dr. Caryl Comes:  Restart Xarelto Take verapamil 40 mg q 8 hours DCCV-schedule hospital visit for one dose flecainide?  Pt advised to start Xarelto, and take verapamil 40 mg q 8 hours.  Advised would follow up regarding hospitalization for potential use of flecainide. Pt indicates understanding.  He will submit a repeat remote in the morning.  Advised would follow up regarding flecainide vs DCCV.

## 2022-03-11 NOTE — Telephone Encounter (Signed)
Remote check received this morning.  Presenting rhythm sinus.

## 2022-03-14 NOTE — Telephone Encounter (Signed)
See mychart messages included in this phone note.  Dr. Caryl Comes recommends referral to Dr. Myles Gip to discuss ablation.  Pt will send mychart message when he is ready to schedule.

## 2022-03-21 ENCOUNTER — Encounter: Payer: Self-pay | Admitting: Cardiovascular Disease

## 2022-03-21 ENCOUNTER — Ambulatory Visit: Payer: Medicare HMO | Attending: Cardiovascular Disease | Admitting: Cardiovascular Disease

## 2022-03-21 VITALS — BP 116/66 | HR 70 | Ht 70.0 in | Wt 191.2 lb

## 2022-03-21 DIAGNOSIS — I4892 Unspecified atrial flutter: Secondary | ICD-10-CM

## 2022-03-21 NOTE — Progress Notes (Signed)
Electrophysiology Office Note:    Date:  03/21/2022   ID:  Kevin Castro, DOB March 15, 1945, MRN [id number]  PCP:  Deland Pretty, [place] Providers Cardiologist:  None     Referring MD: Deland Pretty, MD   History of Present Illness:    Kevin Castro is a 77 y.o. male with a hx listed below, significant for PAF s/p PVI 2017, [place] pacemaker referred for arrhythmia management.  He saw Dr. Caryl Comes in January. Since his ablation, he has had episodes of atrial flutter. In June 22, he had syncope with what appears to be a post-conversion pause. The pacemaker was then placed. One episode of flutter was pace terminated in October 2023. Device interrogation last month showed additional episodes consistent with flutter. He noted decreased exercise tolerance that correlated with flutter episodes.   Past Medical History:  Diagnosis Date   Atrial tachycardia    Paroxysmal atrial fibrillation (HCC)    Prostate CA (HCC)    Sinus bradycardia    Trifascicular block     Past Surgical History:  Procedure Laterality Date   DOBUTAMINE STRESS ECHO     ELECTROPHYSIOLOGIC STUDY     ELECTROPHYSIOLOGIC STUDY N/A 10/16/2015   Procedure: Atrial Fibrillation Ablation;  Surgeon: Thompson Grayer, MD;  Location: [facility];  Service: Cardiovascular;  Laterality: N/A;   PACEMAKER IMPLANT N/A 07/13/2020   Procedure: PACEMAKER IMPLANT;  Surgeon: Deboraha Sprang, MD;  Location: [facility];  Service: Cardiovascular;  Laterality: N/A;    Current Medications: Current Meds  Medication Sig   CLOMID 50 MG tablet Take 50 mg by mouth every 3 (three) days.   Glucosamine-Chondroit-Vit C-Mn (GLUCOSAMINE 1500 COMPLEX PO) Take 1,500 mg by mouth daily. 1500 mg / 1200 mg   Multiple Vitamin (MULTI-VITAMIN) tablet Take 1 tablet by mouth daily.   naproxen sodium (ALEVE) 220 MG tablet Take 220-440 mg by mouth daily as needed (pain).   Omega-3 Fatty Acids (FISH OIL) 1000 MG CAPS Take 1,000  mg by mouth daily.   propranolol (INDERAL) 10 MG tablet TAKE 1 TABLET BY MOUTH AS NEEDED.   rivaroxaban (XARELTO) 20 MG TABS tablet Take 1 tablet (20 mg total) by mouth daily with supper.   rosuvastatin (CRESTOR) 5 MG tablet Take 5 mg by mouth daily.   tamsulosin (FLOMAX) 0.4 MG CAPS capsule 0.4 mg daily.   verapamil (CALAN) 40 MG tablet Take 1 tablet (40 mg) by mouth as needed for tachycardia, can repeat in 30 minutes x 1 tablet (40 mg) if needed     Allergies:   Penicillins   Social History   Socioeconomic History   Marital status: Married    Spouse name: Not on file   Number of children: 2   Years of education: Not on file   Highest education level: Not on file  Occupational History   Occupation: Retired Art gallery manager  Tobacco Use   Smoking status: Former    Packs/day: 0.75    Years: 7.00    Total pack years: 5.25    Types: Cigarettes    Quit date: 01/31/1973    Years since quitting: 49.1   Smokeless tobacco: Never  Vaping Use   Vaping Use: Never used  Substance and Sexual Activity   Alcohol use: Yes    Alcohol/week: 12.0 standard drinks of alcohol    Types: 12 Cans of beer per week    Comment: "can go through a couple six packs on the weekend"   Drug  use: No   Sexual activity: Yes  Other Topics Concern   Not on file  Social History Narrative   Two adopted children   Social Determinants of Health   Financial Resource Strain: Not on file  Food Insecurity: Not on file  Transportation Needs: Not on file  Physical Activity: Not on file  Stress: Not on file  Social Connections: Not on file     Family History: The patient's family history includes Arrhythmia in his brother, mother, and sister; Breast cancer in his cousin; Diabetes in his brother; Liver disease in his father; Lung cancer in his father; Stroke in his mother. There is no history of Heart attack, Hypertension, Prostate cancer, Pancreatic cancer, or Colon cancer.  ROS:   Please see the history of  present illness.    All other systems reviewed and are negative.  EKGs/Labs/Other Studies Reviewed Today:    Pacemaker interrogation was performed today. I reviewed the event episodes. He continues to have flutter episodes, one lasted about 20 hours.  EKG:  Last EKG results: today sinus rhythm RBBB, LAFB   Recent Labs: 11/05/2021: Hemoglobin 12.4; Platelet Count 162     Physical Exam:    VS:  BP 116/66   Pulse 70   Ht 5' 10"$  (1.778 m)   Wt 191 lb 3.2 oz (86.7 kg)   SpO2 97%   BMI 27.43 kg/m     Wt Readings from Last 3 Encounters:  03/21/22 191 lb 3.2 oz (86.7 kg)  11/05/21 185 lb 9.6 oz (84.2 kg)  07/28/21 186 lb 3.2 oz (84.5 kg)     GEN: Well nourished, well developed in no acute distress CARDIAC: RRR, no murmurs, rubs, gallops RESPIRATORY:  Normal work of breathing MUSCULOSKELETAL: no edema    ASSESSMENT & PLAN:    Atrial flutter: I have not seen the rhythm on ECG but suspect he has an atypical flutter in the setting of prior PVI. He is symptomatic with fatigue, and his ventricular rates are marginally controlled. I think rhythm control is appropriate. I described the flutter ablation procedure and explained that we would attempt to map and induce his specific flutter, but if we were unable to do so, we would perform an empiric ablation for right-sided and left-sided flutters. We discussed the indication, rationale, logistics, anticipated benefits, and potential risks of the ablation procedure including but not limited to -- bleed at the groin access site, chest pain, damage to nearby organs such as the diaphragm, lungs, or esophagus, need for a drainage tube, or prolonged hospitalization. I explained that the risk for stroke, heart attack, need for open chest surgery, or even death is very low but not zero. he  expressed understanding and will consider his options. History of post-conversion pause: [employer] dual chamber pacemaker: normal function          Medication Adjustments/Labs and Tests Ordered: Current medicines are reviewed at length with the patient today.  Concerns regarding medicines are outlined above.  Orders Placed This Encounter  Procedures   EKG 12-Lead   No orders of the defined types were placed in this encounter.    Signed, Melida Quitter, MD  03/21/2022 12:53 PM    [place]

## 2022-03-21 NOTE — Patient Instructions (Signed)
Medication Instructions:  Your physician recommends that you continue on your current medications as directed. Please refer to the Current Medication list given to you today.  *If you need a refill on your cardiac medications before your next appointment, please call your pharmacy*  Lab Work: None ordered  Testing/Procedures: None ordered  Follow-Up: Keep follow-up appointment with Dr. Caryl Comes scheduled for 07/11/2022 at 02:15PM.

## 2022-03-30 ENCOUNTER — Telehealth: Payer: Self-pay

## 2022-03-30 NOTE — Patient Instructions (Signed)
Visit Information  Thank you for taking time to visit with me today. Please don't hesitate to contact me if I can be of assistance to you.   Following are the goals we discussed today:   Goals Addressed             This Visit's Progress    COMPLETED: Care Coordination Activities-No follow up required       Care Coordination Interventions: Advised patient to Annual Wellness exam. Discussed [facility] services and support. Interventions Today    Flowsheet Row Most Recent Value  General Interventions   General Interventions Discussed/Reviewed General Interventions Discussed, Doctor Visits  Doctor Visits Discussed/Reviewed Doctor Visits Discussed, Annual Wellness Visits              If you are experiencing a Mental Health or [place] or need someone to talk to, please call the Suicide and Crisis Lifeline: 988   Patient verbalizes understanding of instructions and care plan provided today and agrees to view in [facility]. Active MyChart status and patient understanding of how to access instructions and care plan via MyChart confirmed with patient.     No further follow up required: decline  Jone Baseman, RN, MSN [facility] Care Management Coordinator Direct Line [phone]

## 2022-03-30 NOTE — Patient Outreach (Signed)
  Care Coordination   Initial Visit Note   03/30/2022 Name: Kevin Castro MRN: [id number] DOB: October 27, 1945  Kevin Castro is a 77 y.o. year old male who sees Deland Pretty, MD for primary care. I spoke with  Ofilia Neas by phone today.  What matters to the patients health and wellness today?  none    Goals Addressed             This Visit's Progress    COMPLETED: Care Coordination Activities-No follow up required       Care Coordination Interventions: Advised patient to Annual Wellness exam. Discussed [facility] services and support. Interventions Today    Flowsheet Row Most Recent Value  General Interventions   General Interventions Discussed/Reviewed General Interventions Discussed, Doctor Visits  Doctor Visits Discussed/Reviewed Doctor Visits Discussed, Annual Wellness Visits             SDOH assessments and interventions completed:  Yes  SDOH Interventions Today    Flowsheet Row Most Recent Value  SDOH Interventions   Transportation Interventions Intervention Not Indicated        Care Coordination Interventions:  Yes, provided   Follow up plan: No further intervention required.   Encounter Outcome:  Pt. Visit Completed   Jone Baseman, RN, MSN [facility] Care Management Coordinator Direct Line [phone]

## 2022-04-12 ENCOUNTER — Ambulatory Visit (INDEPENDENT_AMBULATORY_CARE_PROVIDER_SITE_OTHER): Payer: Medicare HMO

## 2022-04-12 DIAGNOSIS — I480 Paroxysmal atrial fibrillation: Secondary | ICD-10-CM

## 2022-04-14 LAB — CUP PACEART REMOTE DEVICE CHECK
Battery Remaining Longevity: 168 mo
Battery Remaining Percentage: 100 %
Brady Statistic RA Percent Paced: 0 %
Brady Statistic RV Percent Paced: 0 %
Date Time Interrogation Session: 20240312041100
Implantable Lead Connection Status: 753985
Implantable Lead Connection Status: 753985
Implantable Lead Implant Date: 20220613
Implantable Lead Implant Date: 20220613
Implantable Lead Location: 753859
Implantable Lead Location: 753860
Implantable Lead Model: 7841
Implantable Lead Model: 7842
Implantable Lead Serial Number: 1074164
Implantable Lead Serial Number: 1156308
Implantable Pulse Generator Implant Date: 20220613
Lead Channel Impedance Value: 539 Ohm
Lead Channel Impedance Value: 600 Ohm
Lead Channel Pacing Threshold Amplitude: 0.5 V
Lead Channel Pacing Threshold Amplitude: 0.8 V
Lead Channel Pacing Threshold Pulse Width: 0.4 ms
Lead Channel Pacing Threshold Pulse Width: 0.4 ms
Lead Channel Setting Pacing Amplitude: 2 V
Lead Channel Setting Pacing Amplitude: 2 V
Lead Channel Setting Pacing Pulse Width: 0.4 ms
Lead Channel Setting Sensing Sensitivity: 2.5 mV
Pulse Gen Serial Number: 994472
Zone Setting Status: 755011

## 2022-04-15 DIAGNOSIS — C61 Malignant neoplasm of prostate: Secondary | ICD-10-CM | POA: Diagnosis not present

## 2022-04-26 DIAGNOSIS — E349 Endocrine disorder, unspecified: Secondary | ICD-10-CM | POA: Diagnosis not present

## 2022-05-09 DIAGNOSIS — H524 Presbyopia: Secondary | ICD-10-CM | POA: Diagnosis not present

## 2022-05-09 DIAGNOSIS — H52223 Regular astigmatism, bilateral: Secondary | ICD-10-CM | POA: Diagnosis not present

## 2022-05-20 DIAGNOSIS — E349 Endocrine disorder, unspecified: Secondary | ICD-10-CM | POA: Diagnosis not present

## 2022-05-20 NOTE — Progress Notes (Signed)
Remote pacemaker transmission.   

## 2022-06-17 DIAGNOSIS — E349 Endocrine disorder, unspecified: Secondary | ICD-10-CM | POA: Diagnosis not present

## 2022-06-20 DIAGNOSIS — E349 Endocrine disorder, unspecified: Secondary | ICD-10-CM | POA: Diagnosis not present

## 2022-06-24 ENCOUNTER — Encounter: Payer: Self-pay | Admitting: Internal Medicine

## 2022-06-24 NOTE — Progress Notes (Signed)
If pt presents w ongoing Afib of more 24 hrs duration, he has started already his Xarelto, is taking verapamil and have asked him to go to ER for flecainide 300 m x 1 dose w 6 hr observation and then DCCV if "pill in pocket " is ineffective

## 2022-06-25 ENCOUNTER — Emergency Department (HOSPITAL_BASED_OUTPATIENT_CLINIC_OR_DEPARTMENT_OTHER): Payer: Medicare HMO

## 2022-06-25 ENCOUNTER — Other Ambulatory Visit: Payer: Self-pay

## 2022-06-25 ENCOUNTER — Emergency Department (HOSPITAL_BASED_OUTPATIENT_CLINIC_OR_DEPARTMENT_OTHER)
Admission: EM | Admit: 2022-06-25 | Discharge: 2022-06-25 | Disposition: A | Discharge status: Home / Self Care | Payer: Medicare HMO | Attending: Emergency Medicine | Admitting: Emergency Medicine

## 2022-06-25 ENCOUNTER — Encounter (HOSPITAL_BASED_OUTPATIENT_CLINIC_OR_DEPARTMENT_OTHER): Payer: Self-pay | Admitting: Emergency Medicine

## 2022-06-25 DIAGNOSIS — Z7901 Long term (current) use of anticoagulants: Secondary | ICD-10-CM | POA: Diagnosis not present

## 2022-06-25 DIAGNOSIS — I4891 Unspecified atrial fibrillation: Secondary | ICD-10-CM | POA: Diagnosis not present

## 2022-06-25 DIAGNOSIS — Z8546 Personal history of malignant neoplasm of prostate: Secondary | ICD-10-CM | POA: Insufficient documentation

## 2022-06-25 DIAGNOSIS — R0609 Other forms of dyspnea: Secondary | ICD-10-CM | POA: Diagnosis not present

## 2022-06-25 LAB — CBC WITH DIFFERENTIAL/PLATELET
Abs Immature Granulocytes: 0.01 10*3/uL (ref 0.00–0.07)
Basophils Absolute: 0.1 10*3/uL (ref 0.0–0.1)
Basophils Relative: 1 %
Eosinophils Absolute: 0.1 10*3/uL (ref 0.0–0.5)
Eosinophils Relative: 3 %
HCT: 41.3 % (ref 39.0–52.0)
Hemoglobin: 14.2 g/dL (ref 13.0–17.0)
Immature Granulocytes: 0 %
Lymphocytes Relative: 24 %
Lymphs Abs: 1 10*3/uL (ref 0.7–4.0)
MCH: 32.9 pg (ref 26.0–34.0)
MCHC: 34.4 g/dL (ref 30.0–36.0)
MCV: 95.6 fL (ref 80.0–100.0)
Monocytes Absolute: 0.4 10*3/uL (ref 0.1–1.0)
Monocytes Relative: 9 %
Neutro Abs: 2.8 10*3/uL (ref 1.7–7.7)
Neutrophils Relative %: 63 %
Platelets: 188 10*3/uL (ref 150–400)
RBC: 4.32 MIL/uL (ref 4.22–5.81)
RDW: 13 % (ref 11.5–15.5)
WBC: 4.4 10*3/uL (ref 4.0–10.5)
nRBC: 0 % (ref 0.0–0.2)

## 2022-06-25 LAB — BASIC METABOLIC PANEL
Anion gap: 6 (ref 5–15)
BUN: 20 mg/dL (ref 8–23)
CO2: 29 mmol/L (ref 22–32)
Calcium: 9.4 mg/dL (ref 8.9–10.3)
Chloride: 104 mmol/L (ref 98–111)
Creatinine, Ser: 0.96 mg/dL (ref 0.61–1.24)
GFR, Estimated: >60 mL/min (ref >60)
Glucose, Bld: 98 mg/dL (ref 70–99)
Potassium: 4.5 mmol/L (ref 3.5–5.1)
Sodium: 139 mmol/L (ref 135–145)

## 2022-06-25 LAB — BRAIN NATRIURETIC PEPTIDE: B Natriuretic Peptide: 85.1 pg/mL (ref 0.0–100.0)

## 2022-06-25 LAB — MAGNESIUM: Magnesium: 1.9 mg/dL (ref 1.7–2.4)

## 2022-06-25 MED ORDER — FLECAINIDE ACETATE 50 MG PO TABS
300.0000 mg | ORAL_TABLET | Freq: Once | ORAL | Status: AC
  Administered 2022-06-25: 300 mg via ORAL
  Filled 2022-06-25: qty 6

## 2022-06-25 MED ORDER — VERAPAMIL HCL 40 MG PO TABS: 40.0000 mg | ORAL_TABLET | Freq: Once | ORAL | Status: DC

## 2022-06-25 NOTE — ED Provider Notes (Cosign Needed)
[place] EMERGENCY DEPARTMENT AT [facility] Provider Note   CSN: [id number] Arrival date & time: 06/25/22  9629     History  Chief Complaint  Patient presents with   Atrial Fibrillation    Kevin Castro is a 77 y.o. male.  With PMH of paroxysmal A-fib on Xarelto and diltiazem, history of trifascicular block status post pacemaker/ICD, prostate cancer who presents with A-fib ongoing for 2 days.  He has mild dyspnea on exertion and fatigue on exertion when he is in A-fib but otherwise resting he is without any complaints.  He has had no syncopal episodes, no chest pain, no fevers, no coughing, no vomiting or diarrhea.  He took his dose of Xarelto this morning and has been on it for the past 3 days started by his cardiologist Dr. Graciela Husbands and is also on diltiazem but did not take his diltiazem dose this morning.  He has never been cardioverted in the past.  He called his cardiologist due to ongoing A-fib for the past 2 days who recommended he go to the ER for flecainide load.  Patient would like to avoid being DC cardioverted if possible.   Atrial Fibrillation       Home Medications Prior to Admission medications   Medication Sig Start Date End Date Taking? Authorizing Provider  CLOMID 50 MG tablet Take 50 mg by mouth every 3 (three) days. 01/21/22   [provider]  Glucosamine-Chondroit-Vit C-Mn (GLUCOSAMINE 1500 COMPLEX PO) Take 1,500 mg by mouth daily. 1500 mg / 1200 mg    [provider]  Multiple Vitamin (MULTI-VITAMIN) tablet Take 1 tablet by mouth daily.    [provider]  naproxen sodium (ALEVE) 220 MG tablet Take 220-440 mg by mouth daily as needed (pain).    [provider]  Omega-3 Fatty Acids (FISH OIL) 1000 MG CAPS Take 1,000 mg by mouth daily.    [provider]  propranolol (INDERAL) 10 MG tablet TAKE 1 TABLET BY MOUTH AS NEEDED. 08/20/21   Duke Salvia, MD  rivaroxaban (XARELTO) 20 MG TABS tablet Take 1 tablet (20  mg total) by mouth daily with supper. 03/10/22   Duke Salvia, MD  rosuvastatin (CRESTOR) 5 MG tablet Take 5 mg by mouth daily. 09/28/20   [provider]  tamsulosin (FLOMAX) 0.4 MG CAPS capsule 0.4 mg daily.    [provider]  verapamil (CALAN) 40 MG tablet Take 1 tablet (40 mg) by mouth as needed for tachycardia, can repeat in 30 minutes x 1 tablet (40 mg) if needed 12/11/20   Duke Salvia, MD      Allergies    Penicillins    Review of Systems   Review of Systems  Physical Exam Updated Vital Signs BP 98/74 (BP Location: Left Arm)   Pulse 64   Temp 98.2 F (36.8 C) (Oral)   Resp 20   SpO2 99%  Physical Exam Constitutional: Alert and oriented. Well appearing and in no distress. Eyes: Conjunctivae are normal. ENT      Head: Normocephalic and atraumatic. Cardiovascular: Irregularly irregular rhythm, borderline tachycardic, equal palpable radial and DP pulses warm well-perfused Respiratory: Normal respiratory effort. Breath sounds are normal.  O2 sat 98 on RA Gastrointestinal: Nondistended Musculoskeletal: Normal range of motion in all extremities.      Right lower leg: No tenderness or edema.      Left lower leg: No tenderness or edema. Neurologic: Normal speech and language.  No facial droop.  Moving all extremities  equally.  Sensation grossly intact.  No gross focal neurologic deficits are appreciated. Skin: Skin is warm, dry and intact. No rash noted. Psychiatric: Mood and affect are normal. Speech and behavior are normal.  ED Results / Procedures / Treatments   Labs (all labs ordered are listed, but only abnormal results are displayed) Labs Reviewed  BASIC METABOLIC PANEL  CBC WITH DIFFERENTIAL/PLATELET  MAGNESIUM  BRAIN NATRIURETIC PEPTIDE    EKG EKG Interpretation  Date/Time:  Saturday Jun 25 2022 12:06:23 EDT Ventricular Rate:  56 PR Interval:  152 QRS Duration: 146 QT Interval:  465 QTC Calculation: 449 R Axis:   268 Text  Interpretation: Sinus rhythm with PACs Atrial premature complex RBBB and LAFB Confirmed by Vivien Rossetti ([id number]) on 06/25/2022 1:33:33 PM  Radiology DG Chest Port 1 View  Result Date: 06/25/2022 CLINICAL DATA:  Atrial fibrillation. EXAM: PORTABLE CHEST 1 VIEW COMPARISON:  Chest radiograph 07/13/2020 FINDINGS: Left chest wall cardiac pacer is again seen with leads overlying the right atrium and right ventricle. Cardiac silhouette is at the upper limits of normal size. Mediastinal contours are within limits. Mild calcification within aortic arch. Linear scarring with an inferolateral left lung at the lingula is unchanged from prior CT. The lungs are otherwise clear. No pleural effusion or pneumothorax. No acute skeletal abnormality. IMPRESSION: No acute cardiopulmonary disease process. Electronically Signed   By: Neita Garnet M.D.   On: 06/25/2022 10:41    Procedures .Critical Care  Performed by: Mardene Sayer, MD Authorized by: Mardene Sayer, MD   Critical care provider statement:    Critical care time (minutes):  40   Critical care was necessary to treat or prevent imminent or life-threatening deterioration of the following conditions:  Cardiac failure   Critical care was time spent personally by me on the following activities:  Development of treatment plan with patient or surrogate, discussions with consultants, evaluation of patient's response to treatment, examination of patient, ordering and review of laboratory studies, ordering and review of radiographic studies, ordering and performing treatments and interventions, pulse oximetry, re-evaluation of patient's condition, review of old charts and obtaining history from patient or surrogate     Medications Ordered in ED Medications  flecainide (TAMBOCOR) tablet 300 mg (300 mg Oral Given 06/25/22 1045)    ED Course/ Medical Decision Making/ A&P Clinical Course as of 06/25/22 1732  Sat Jun 25, 2022  1120 Consulted patient's  cardiologist Dr. Graciela Husbands around 1030 still waiting on callback will reconsult. [VB]  1337 S/w Dr Vedia Pereyra on-call cardiologist who has reviewed patient's EKG at 1206 and notes that he has sinus rhythm with PACs.  He is out of A-fib.  He is safe to take his verapamil 40 mg dose.  Plan is for him to follow-up outpatient with Dr. Graciela Husbands for likely outpatient ablation. [VB]    Clinical Course User Index [VB] Mardene Sayer, MD                             Medical Decision Making Kevin Castro is a 77 y.o. male.  With PMH of paroxysmal A-fib on Xarelto and diltiazem, history of trifascicular block status post pacemaker/ICD, prostate cancer who presents with A-fib ongoing for 2 days.  Patient presented in stable A-fib no significant RVR.  He had nonspecific ST/T changes on EKG however likely rate related and bundle branch related.  No findings concerning for STEMI.  No symptoms or presentation concerning for ACS.  Obtain labs to ensure no acute electrolyte abnormalities or anemia contributing to patient's presentation.  Chest x-ray reviewed by me no evidence of pneumonia or pulmonary edema.  His labs were unremarkable.  Potassium 4.5.  Creatinine 0.96 within normal limits.  Normal white blood cell count 4.4 normal hemoglobin 14.2, no anemia.  Magnesium within normal limits.  Dr. Graciela Husbands patient's EP Dr. Recommended oral 300 mg flecainide and observation for 6 hours and if no return then DCCV.  Patient was observed for approximately 4 hours and cardioverted to sinus rhythm with PACs.  S/w Dr Vedia Pereyra on-call cardiologist who has reviewed patient's EKG at 1206 PM obtained today and notes that he has sinus rhythm with PACs.  He is out of A-fib.  He is safe to take his verapamil 40 mg dose.  Plan is for him to follow-up outpatient with Dr. Graciela Husbands for likely outpatient ablation. Return precautions discussed. [VB]  Amount and/or Complexity of Data Reviewed Labs: ordered. Radiology:  ordered.  Risk Prescription drug management.      Final Clinical Impression(s) / ED Diagnoses Final diagnoses:  Atrial fibrillation, unspecified type [facility])    Rx / DC Orders ED Discharge Orders     None         Mardene Sayer, MD 06/25/22 1732

## 2022-06-25 NOTE — Discharge Instructions (Signed)
Make an appointment with Dr. Graciela Husbands as planned for outpatient follow-up regarding your episodes of A-fib.  Keep taking your Xarelto daily and do not miss any doses as well as your verapamil as prescribed.  Come back if any fainting episodes, severe chest pain, shortness of breath, fast heart rates or any other symptoms concerning to you.

## 2022-06-25 NOTE — ED Triage Notes (Signed)
Pt presents to ED POV. Pt c/o being in A. Fib x2d. Pt reports that he talked to his cardiologist and they told him to come here to get flecainide. Pt has [employer] pacemaker.

## 2022-06-25 NOTE — ED Notes (Signed)
Patient given discharge instructions. Questions were answered. Patient verbalized understanding of discharge instructions and care at home.  

## 2022-07-01 ENCOUNTER — Telehealth: Payer: Self-pay | Admitting: Internal Medicine

## 2022-07-01 ENCOUNTER — Telehealth: Payer: Self-pay

## 2022-07-01 ENCOUNTER — Encounter: Payer: Self-pay | Admitting: Internal Medicine

## 2022-07-01 DIAGNOSIS — I480 Paroxysmal atrial fibrillation: Secondary | ICD-10-CM

## 2022-07-01 MED ORDER — VERAPAMIL HCL 40 MG PO TABS: ORAL_TABLET | ORAL | 6 refills | Status: DC

## 2022-07-01 NOTE — Telephone Encounter (Signed)
Spoke with pt and advised he currently remains in Afib.  Pt states he took Verapamil 40mg  - 1 tablet by mouth at 1230.  Current HR per pt is now 102 down from the 150's.  Pt denies current CP, SOB or dizziness.   Pt advised Dr Graciela Husbands is out of the office through next week.  Pt would like to discuss medication options discussed by Dr Graciela Husbands such as Flecainide (Pill in the pocket) as this worked in the ED on 06/25/2022.  Pt states he is also considering ablation.as discussed with Dr Nelly Laurence.  Appointment scheduled with Afib clinic for 07/04/2022 at 130.  Pt advised to continue to monitor HR, continue medications as prescribed.  Reviewed ED precautions.  Pt verbalizes understanding and agrees with current plan.

## 2022-07-01 NOTE — Telephone Encounter (Signed)
   Yes-went in to Afib today at 11:00 am.

## 2022-07-01 NOTE — Telephone Encounter (Signed)
Transition Care Management Follow-up Telephone Call Date of discharge and from where: 06/25/2022 [facility] How have you been since you were released from the hospital? Patient is feeling better but experienced A-fib while working out at the gym today. Any questions or concerns? No  Items Reviewed: Did the pt receive and understand the discharge instructions provided? Yes  Medications obtained and verified? Yes  Other? No  Any new allergies since your discharge? No  Dietary orders reviewed? Yes Do you have support at home? Yes   Follow up appointments reviewed:  PCP Hospital f/u appt confirmed? No  Scheduled to see  on  @ . Specialist Hospital f/u appt confirmed? Yes  Scheduled to see Sherryl Manges, MD on 07/11/2022 @ [facility] at [facility]. Are transportation arrangements needed? No  If their condition worsens, is the pt aware to call PCP or go to the Emergency Dept.? Yes Was the patient provided with contact information for the PCP's office or ED? Yes Was to pt encouraged to call back with questions or concerns? Yes  Jordis Repetto [facility]  [facility] Population Health Community Resource Care Guide   [email or web address]  ?? [phone]   Website: [email or web address]  [email or web address]

## 2022-07-01 NOTE — Telephone Encounter (Signed)
Patient c/o Palpitations:  High priority if patient c/o lightheadedness, shortness of breath, or chest pain  How long have you had palpitations/irregular HR/ Afib? Are you having the symptoms now?  Went into afib during an exercise class this morning, still in afib  Patient states that he is interested in starting on Flecainide or Verapamil  Are you currently experiencing lightheadedness, SOB or CP?  No   Do you have a history of afib (atrial fibrillation) or irregular heart rhythm?  Yes   Have you checked your BP or HR? (document readings if available):  92 HR right now  Are you experiencing any other symptoms?  No

## 2022-07-04 ENCOUNTER — Ambulatory Visit (HOSPITAL_COMMUNITY): Payer: Medicare HMO | Admitting: Internal Medicine

## 2022-07-04 NOTE — Telephone Encounter (Signed)
Pt back in NSR. Canceled appt per pt request.

## 2022-07-05 DIAGNOSIS — I4892 Unspecified atrial flutter: Secondary | ICD-10-CM | POA: Insufficient documentation

## 2022-07-11 ENCOUNTER — Ambulatory Visit: Payer: Medicare HMO | Attending: Internal Medicine | Admitting: Internal Medicine

## 2022-07-11 ENCOUNTER — Encounter: Payer: Self-pay | Admitting: Internal Medicine

## 2022-07-11 VITALS — BP 112/68 | HR 94 | Resp 16 | Ht 70.0 in | Wt 193.2 lb

## 2022-07-11 DIAGNOSIS — R002 Palpitations: Secondary | ICD-10-CM | POA: Diagnosis not present

## 2022-07-11 DIAGNOSIS — I4892 Unspecified atrial flutter: Secondary | ICD-10-CM

## 2022-07-11 DIAGNOSIS — I480 Paroxysmal atrial fibrillation: Secondary | ICD-10-CM

## 2022-07-11 DIAGNOSIS — R55 Syncope and collapse: Secondary | ICD-10-CM

## 2022-07-11 DIAGNOSIS — I453 Trifascicular block: Secondary | ICD-10-CM

## 2022-07-11 DIAGNOSIS — Z950 Presence of cardiac pacemaker: Secondary | ICD-10-CM

## 2022-07-11 LAB — CUP PACEART INCLINIC DEVICE CHECK
Brady Statistic RA Percent Paced: 1 % — CL
Brady Statistic RV Percent Paced: 1 % — CL
Date Time Interrogation Session: 20240610150420
Implantable Lead Connection Status: 753985
Implantable Lead Connection Status: 753985
Implantable Lead Implant Date: 20220613
Implantable Lead Implant Date: 20220613
Implantable Lead Location: 753859
Implantable Lead Location: 753860
Implantable Lead Model: 7841
Implantable Lead Model: 7842
Implantable Lead Serial Number: 1074164
Implantable Lead Serial Number: 1156308
Implantable Pulse Generator Implant Date: 20220613
Lead Channel Impedance Value: 541 Ohm
Lead Channel Impedance Value: 586 Ohm
Lead Channel Pacing Threshold Amplitude: 0.5 V
Lead Channel Pacing Threshold Amplitude: 0.8 V
Lead Channel Pacing Threshold Pulse Width: 0.4 ms
Lead Channel Pacing Threshold Pulse Width: 0.4 ms
Lead Channel Sensing Intrinsic Amplitude: 14.3 mV
Lead Channel Sensing Intrinsic Amplitude: 2 mV
Pulse Gen Serial Number: 994472

## 2022-07-11 MED ORDER — FLECAINIDE ACETATE 100 MG PO TABS
ORAL_TABLET | ORAL | 0 refills | Status: DC
Start: 2022-07-11 — End: 2023-07-03

## 2022-07-11 NOTE — Patient Instructions (Signed)
Medication Instructions:  Your physician recommends that you continue on your current medications as directed. Please refer to the Current Medication list given to you today.  *If you need a refill on your cardiac medications before your next appointment, please call your pharmacy*   Lab Work: None ordered.  If you have labs (blood work) drawn today and your tests are completely normal, you will receive your results only by: MyChart Message (if you have MyChart) OR A paper copy in the mail If you have any lab test that is abnormal or we need to change your treatment, we will call you to review the results.   Testing/Procedures: None ordered.    Follow-Up: At  [facility], you and your health needs are our priority.  As part of our continuing mission to provide you with exceptional heart care, we have created designated Provider Care Teams.  These Care Teams include your primary Cardiologist (physician) and Advanced Practice Providers (APPs -  Physician Assistants and Nurse Practitioners) who all work together to provide you with the care you need, when you need it.  We recommend signing up for the patient portal called "MyChart".  Sign up information is provided on this After Visit Summary.  MyChart is used to connect with patients for Virtual Visits (Telemedicine).  Patients are able to view lab/test results, encounter notes, upcoming appointments, etc.  Non-urgent messages can be sent to your provider as well.   To learn more about what you can do with MyChart, go to [email or web address].    Your next appointment:   9 months with Dr Klein 

## 2022-07-11 NOTE — Progress Notes (Signed)
Patient Care Team: Merri Brunette, MD as PCP - General (Internal Medicine)   HPI  Kevin Castro is a 77 y.o. male for atrial fib.with progressive trifasicular block and recurrent syncope Hx of PVI ablation JA 2017 cx by post procedural SOB. 2022 syncope with abrupt decrease in heart rate (140-close to 0) >> with underlying trifascicular block>> pacemaker [facility] [employer] 6/22.   11/22 seen after called by  BB, riding with pt and had persistent tachycardia which on transmission was aflutter 2:1 AVblock.  Self terminating after about 4.5 hrs. refer to Dr. Thea Silversmith for consideration of ablation of atrial fibrillation/flutter; they deferred   Recurrent atrial fibrillation.  A prolonged duration, ended up in the ER (5/24) for flecainide "pill in the pocket "which was successful  Recurrent flutte 5/31>> terminated that night.  Interested in pursuing ablation  and pulse field    He has a history of prostate cancer.  His testosterone is improving.  He has discontinued his statin and his aspirin at this time.  DATE TEST EF   7/21 CTA   % Ca Score 0 RCA 0-25%   8/22 CTA  CaScore 10  5/23 PET stress 48% Normal perfusion    Date Cr K Hgb LDL  8/22 1.04 4.7 12.4<<13.2    3/23   12.5<<11.9    Ferritin 102  FESat 26%. Myeloma panel & B12 normal  Thromboembolic risk factors ( age -26) for a CHADSVASc Score of 1       Past Medical History:  Diagnosis Date   Atrial tachycardia    Paroxysmal atrial fibrillation (HCC)    Prostate CA (HCC)    Sinus bradycardia    Trifascicular block     Past Surgical History:  Procedure Laterality Date   DOBUTAMINE STRESS ECHO     ELECTROPHYSIOLOGIC STUDY     ELECTROPHYSIOLOGIC STUDY N/A 10/16/2015   Procedure: Atrial Fibrillation Ablation;  Surgeon: Hillis Range, MD;  Location: [facility] INVASIVE CV LAB;  Service: Cardiovascular;  Laterality: N/A;   PACEMAKER IMPLANT N/A 07/13/2020   Procedure: PACEMAKER IMPLANT;  Surgeon: Duke Salvia, MD;  Location: [facility]  INVASIVE CV LAB;  Service: Cardiovascular;  Laterality: N/A;    Current Outpatient Medications  Medication Sig Dispense Refill   Glucosamine-Chondroit-Vit C-Mn (GLUCOSAMINE 1500 COMPLEX PO) Take 1,500 mg by mouth daily. 1500 mg / 1200 mg     Multiple Vitamin (MULTI-VITAMIN) tablet Take 1 tablet by mouth daily.     naproxen sodium (ALEVE) 220 MG tablet Take 220-440 mg by mouth daily as needed (pain).     Omega-3 Fatty Acids (FISH OIL) 1000 MG CAPS Take 1,000 mg by mouth daily.     propranolol (INDERAL) 10 MG tablet TAKE 1 TABLET BY MOUTH AS NEEDED. 30 tablet 11   rivaroxaban (XARELTO) 20 MG TABS tablet Take 1 tablet (20 mg total) by mouth daily with supper. 30 tablet 11   rosuvastatin (CRESTOR) 5 MG tablet Take 5 mg by mouth daily.     tamsulosin (FLOMAX) 0.4 MG CAPS capsule 0.4 mg daily.     verapamil (CALAN) 40 MG tablet Take 1 tablet (40 mg) by mouth as needed for tachycardia, can repeat in 30 minutes x 1 tablet (40 mg) if needed 30 tablet 6   No current facility-administered medications for this visit.    Allergies  Allergen Reactions   Penicillins Rash    Rash/blisters Has patient had a PCN reaction causing immediate rash, facial/tongue/throat swelling, SOB or lightheadedness with hypotension: Yes Has  patient had a PCN reaction causing severe rash involving mucus membranes or skin necrosis: No Has patient had a PCN reaction that required hospitalization No Has patient had a PCN reaction occurring within the last 10 years: No If all of the above answers are "NO", then may proceed with Cephalosporin use.       Review of Systems negative except from HPI and PMH BP 112/68   Pulse 94   Resp (!) 96   Ht 5\' 10"  (1.778 m)   Wt 193 lb 3.2 oz (87.6 kg)   BMI 27.72 kg/m  Well developed and well nourished in no acute distress HENT normal Neck supple with JVP-flat Clear Device pocket well healed; without hematoma or erythema.  There is no tethering  Regular rate and rhythm, no   gallop  murmur Abd-soft with active BS No Clubbing cyanosis  edema Skin-warm and dry A & Oriented  Grossly normal sensory and motor function  ECG sinus @ 94 16/14/42   Device function is normal.Programming changes none  See Paceart for details    Assessment and  Plan  Trifascicular block  Syncope  Pacemaker [employer] device    Ca Score now 10  Palpitations-- frequent atrial ectopy   Atrial fibrillation/flutter prior ablation JA  Recurrent Atrial flutter    Recurrent atrial flutter.  Will continue with pill in the pocket and will represcribe.  He will take his verapamil with recurrent persistent symptoms and again 30 minutes to 1 hour prior to taking the flecainide which he would take if he awakened the next morning i.e. after 12+ hours of atrial fibrillation/flutter.  He would like to pursue catheter ablation.  We discussed pulse field.  [facility] yet [facility]  Have [facility] out to [facility] at [facility].  Device function is normal

## 2022-07-12 ENCOUNTER — Ambulatory Visit: Payer: Medicare HMO

## 2022-07-12 DIAGNOSIS — I452 Bifascicular block: Secondary | ICD-10-CM | POA: Diagnosis not present

## 2022-07-12 LAB — CUP PACEART REMOTE DEVICE CHECK
Battery Remaining Longevity: 162 mo
Battery Remaining Percentage: 100 %
Brady Statistic RA Percent Paced: 0 %
Brady Statistic RV Percent Paced: 0 %
Date Time Interrogation Session: 20240611041200
Implantable Lead Connection Status: 753985
Implantable Lead Connection Status: 753985
Implantable Lead Implant Date: 20220613
Implantable Lead Implant Date: 20220613
Implantable Lead Location: 753859
Implantable Lead Location: 753860
Implantable Lead Model: 7841
Implantable Lead Model: 7842
Implantable Lead Serial Number: 1074164
Implantable Lead Serial Number: 1156308
Implantable Pulse Generator Implant Date: 20220613
Lead Channel Impedance Value: 540 Ohm
Lead Channel Impedance Value: 604 Ohm
Lead Channel Pacing Threshold Amplitude: 0.4 V
Lead Channel Pacing Threshold Amplitude: 0.8 V
Lead Channel Pacing Threshold Pulse Width: 0.4 ms
Lead Channel Pacing Threshold Pulse Width: 0.4 ms
Lead Channel Setting Pacing Amplitude: 2 V
Lead Channel Setting Pacing Amplitude: 2 V
Lead Channel Setting Pacing Pulse Width: 0.4 ms
Lead Channel Setting Sensing Sensitivity: 2.5 mV
Pulse Gen Serial Number: 994472
Zone Setting Status: 755011

## 2022-07-12 NOTE — Addendum Note (Signed)
Addended by: Mariam Dollar on: 07/12/2022 11:05 AM   Modules accepted: Orders

## 2022-07-15 DIAGNOSIS — C61 Malignant neoplasm of prostate: Secondary | ICD-10-CM | POA: Diagnosis not present

## 2022-07-22 DIAGNOSIS — E349 Endocrine disorder, unspecified: Secondary | ICD-10-CM | POA: Diagnosis not present

## 2022-07-22 DIAGNOSIS — R350 Frequency of micturition: Secondary | ICD-10-CM | POA: Diagnosis not present

## 2022-07-22 DIAGNOSIS — Z8546 Personal history of malignant neoplasm of prostate: Secondary | ICD-10-CM | POA: Diagnosis not present

## 2022-07-28 ENCOUNTER — Encounter: Payer: Self-pay | Admitting: Internal Medicine

## 2022-07-28 DIAGNOSIS — I480 Paroxysmal atrial fibrillation: Secondary | ICD-10-CM

## 2022-07-28 MED ORDER — RIVAROXABAN 20 MG PO TABS
20.0000 mg | ORAL_TABLET | Freq: Every day | ORAL | 11 refills | Status: DC
Start: 2022-07-28 — End: 2023-06-14

## 2022-07-28 NOTE — Telephone Encounter (Signed)
Prescription refill request for Xarelto received.  Indication: Afib  Last office visit:07/11/22 Kevin Castro)  Weight: 87.6kg Age: 77 Scr: 0.96 (06/25/22) CrCl: 83.37ml/min  Appropriate dose. Refill sent.

## 2022-08-05 ENCOUNTER — Inpatient Hospital Stay: Payer: Medicare HMO | Admitting: Oncology

## 2022-08-05 ENCOUNTER — Inpatient Hospital Stay: Payer: Medicare HMO | Attending: Oncology

## 2022-08-05 VITALS — BP 128/81 | HR 63 | Temp 98.1°F | Resp 18 | Ht 70.0 in | Wt 193.3 lb

## 2022-08-05 DIAGNOSIS — Z8546 Personal history of malignant neoplasm of prostate: Secondary | ICD-10-CM | POA: Insufficient documentation

## 2022-08-05 DIAGNOSIS — D649 Anemia, unspecified: Secondary | ICD-10-CM | POA: Diagnosis not present

## 2022-08-05 DIAGNOSIS — I453 Trifascicular block: Secondary | ICD-10-CM | POA: Diagnosis not present

## 2022-08-05 DIAGNOSIS — Z950 Presence of cardiac pacemaker: Secondary | ICD-10-CM | POA: Diagnosis not present

## 2022-08-05 DIAGNOSIS — D72819 Decreased white blood cell count, unspecified: Secondary | ICD-10-CM | POA: Insufficient documentation

## 2022-08-05 DIAGNOSIS — I4891 Unspecified atrial fibrillation: Secondary | ICD-10-CM | POA: Diagnosis not present

## 2022-08-05 DIAGNOSIS — R55 Syncope and collapse: Secondary | ICD-10-CM | POA: Insufficient documentation

## 2022-08-05 LAB — CBC WITH DIFFERENTIAL (CANCER CENTER ONLY)
Abs Immature Granulocytes: 0.01 10*3/uL (ref 0.00–0.07)
Basophils Absolute: 0 10*3/uL (ref 0.0–0.1)
Basophils Relative: 1 %
Eosinophils Absolute: 0.2 10*3/uL (ref 0.0–0.5)
Eosinophils Relative: 4 %
HCT: 38.5 % — ABNORMAL LOW (ref 39.0–52.0)
Hemoglobin: 13.1 g/dL (ref 13.0–17.0)
Immature Granulocytes: 0 %
Lymphocytes Relative: 23 %
Lymphs Abs: 0.9 10*3/uL (ref 0.7–4.0)
MCH: 32.8 pg (ref 26.0–34.0)
MCHC: 34 g/dL (ref 30.0–36.0)
MCV: 96.5 fL (ref 80.0–100.0)
Monocytes Absolute: 0.4 10*3/uL (ref 0.1–1.0)
Monocytes Relative: 10 %
Neutro Abs: 2.4 10*3/uL (ref 1.7–7.7)
Neutrophils Relative %: 62 %
Platelet Count: 160 10*3/uL (ref 150–400)
RBC: 3.99 MIL/uL — ABNORMAL LOW (ref 4.22–5.81)
RDW: 13.2 % (ref 11.5–15.5)
WBC Count: 3.9 10*3/uL — ABNORMAL LOW (ref 4.0–10.5)
nRBC: 0 % (ref 0.0–0.2)

## 2022-08-05 NOTE — Progress Notes (Signed)
  [facility] OFFICE PROGRESS NOTE   Diagnosis: Anemia  INTERVAL HISTORY:   Kevin Castro returns as scheduled.  He feels well.  Good appetite.  He is exercising.  He has intermittent episodes of atrial fibrillation and is being scheduled for an ablation procedure at [facility].  He is now taking testosterone replacement.  He was started on anastrozole for gynecomastia.  He continues follow-up with urology for follow-up of prostate cancer and hypogonadism.  He reports episodes of rectal bleeding after bowel movements.  He underwent a colonoscopy last year and was found to have hemorrhoids and radiation proctitis.  Objective:  Vital signs in last 24 hours:  Blood pressure 128/81, pulse 63, temperature 98.1 F (36.7 C), temperature source Oral, resp. rate 18, height 5\' 10"  (1.778 m), weight 193 lb 4.8 oz (87.7 kg), SpO2 100 %.    Lymphatics: No cervical, supraclavicular, axillary, or inguinal nodes Resp: Lungs clear bilaterally Cardio: Regular-there are periods of regular rhythm followed by several rapid beats GI: No hepatosplenomegaly Vascular: No leg edema  Lab Results:  Lab Results  Component Value Date   WBC 3.9 (L) 08/05/2022   HGB 13.1 08/05/2022   HCT 38.5 (L) 08/05/2022   MCV 96.5 08/05/2022   PLT 160 08/05/2022   NEUTROABS 2.4 08/05/2022    CMP  Lab Results  Component Value Date   NA 139 06/25/2022   K 4.5 06/25/2022   CL 104 06/25/2022   CO2 29 06/25/2022   GLUCOSE 98 06/25/2022   BUN 20 06/25/2022   CREATININE 0.96 06/25/2022   CALCIUM 9.4 06/25/2022   PROT 6.9 09/07/2020   ALBUMIN 4.1 09/07/2020   AST 27 09/07/2020   ALT 18 09/07/2020   ALKPHOS 49 09/07/2020   BILITOT 0.9 09/07/2020   GFRNONAA >60 06/25/2022   GFRAA 78 08/21/2019     Medications: I have reviewed the patient's current medications.   Assessment/Plan: Anemia Prostate cancer, T2a with a Gleason score of 4+4 03/11/19- upfront brachytherapy boost at [facility] Radioactive  seeds were implanted into the prostate for a total of 110 Gy 04/23/19 - 05/27/19- prostate, seminal vesicles, and pelvic lymph nodes were boosted with 45 Gy in 25 fractions of 1.8 Gy, for a total dose of 155 Gy Atrial fibrillation Trifascicular block/syncope status post pacemaker placement 07/13/2020    Disposition: Kevin Drace was referred for evaluation of anemia.  He has a history of mild anemia, potentially related to treatment for prostate cancer and hypogonadism.  The anemia could also be related to intermittent rectal bleeding.  The hemoglobin has been normal over the past few months.  He has mild leukopenia with a normal neutrophil count.  I have a low clinical suspicion for a hematopoietic malignancy.  He could have early myelodysplasia.  He will call for symptoms of anemia.  He plans to continue clinical follow-up with Dr. Renne Crigler.  I will be glad to see him back if he develops significant anemia or a new hematologic abnormality.  He will continue follow-up with urology for management of prostate cancer and hypogonadism.  He will follow-up with gastroenterology for rectal bleeding.  Kevin Papas, MD  08/05/2022  8:39 AM

## 2022-08-09 NOTE — Progress Notes (Signed)
Remote pacemaker transmission.   

## 2022-08-26 DIAGNOSIS — Z13220 Encounter for screening for lipoid disorders: Secondary | ICD-10-CM | POA: Diagnosis not present

## 2022-08-26 DIAGNOSIS — Z79899 Other long term (current) drug therapy: Secondary | ICD-10-CM | POA: Diagnosis not present

## 2022-08-26 DIAGNOSIS — R232 Flushing: Secondary | ICD-10-CM | POA: Diagnosis not present

## 2022-09-02 DIAGNOSIS — D649 Anemia, unspecified: Secondary | ICD-10-CM | POA: Diagnosis not present

## 2022-09-02 DIAGNOSIS — N62 Hypertrophy of breast: Secondary | ICD-10-CM | POA: Diagnosis not present

## 2022-09-02 DIAGNOSIS — Z950 Presence of cardiac pacemaker: Secondary | ICD-10-CM | POA: Diagnosis not present

## 2022-09-02 DIAGNOSIS — E291 Testicular hypofunction: Secondary | ICD-10-CM | POA: Diagnosis not present

## 2022-09-02 DIAGNOSIS — I480 Paroxysmal atrial fibrillation: Secondary | ICD-10-CM | POA: Diagnosis not present

## 2022-09-02 DIAGNOSIS — S98121S Partial traumatic amputation of right great toe, sequela: Secondary | ICD-10-CM | POA: Diagnosis not present

## 2022-09-02 DIAGNOSIS — Z125 Encounter for screening for malignant neoplasm of prostate: Secondary | ICD-10-CM | POA: Diagnosis not present

## 2022-09-02 DIAGNOSIS — E7800 Pure hypercholesterolemia, unspecified: Secondary | ICD-10-CM | POA: Diagnosis not present

## 2022-09-02 DIAGNOSIS — D6869 Other thrombophilia: Secondary | ICD-10-CM | POA: Diagnosis not present

## 2022-09-02 DIAGNOSIS — D72819 Decreased white blood cell count, unspecified: Secondary | ICD-10-CM | POA: Diagnosis not present

## 2022-09-02 DIAGNOSIS — Z0000 Encounter for general adult medical examination without abnormal findings: Secondary | ICD-10-CM | POA: Diagnosis not present

## 2022-09-02 DIAGNOSIS — I2510 Atherosclerotic heart disease of native coronary artery without angina pectoris: Secondary | ICD-10-CM | POA: Diagnosis not present

## 2022-09-02 DIAGNOSIS — M79672 Pain in left foot: Secondary | ICD-10-CM | POA: Diagnosis not present

## 2022-09-26 ENCOUNTER — Encounter: Payer: Self-pay | Admitting: Internal Medicine

## 2022-10-05 DIAGNOSIS — R310 Gross hematuria: Secondary | ICD-10-CM | POA: Diagnosis not present

## 2022-10-05 DIAGNOSIS — N401 Enlarged prostate with lower urinary tract symptoms: Secondary | ICD-10-CM | POA: Diagnosis not present

## 2022-10-05 DIAGNOSIS — R350 Frequency of micturition: Secondary | ICD-10-CM | POA: Diagnosis not present

## 2022-10-17 ENCOUNTER — Ambulatory Visit (INDEPENDENT_AMBULATORY_CARE_PROVIDER_SITE_OTHER): Payer: Medicare HMO

## 2022-10-17 DIAGNOSIS — R55 Syncope and collapse: Secondary | ICD-10-CM

## 2022-10-18 LAB — CUP PACEART REMOTE DEVICE CHECK
Battery Remaining Longevity: 168 mo
Battery Remaining Percentage: 100 %
Brady Statistic RA Percent Paced: 0 %
Brady Statistic RV Percent Paced: 0 %
Date Time Interrogation Session: 20240916160900
Implantable Lead Connection Status: 753985
Implantable Lead Connection Status: 753985
Implantable Lead Implant Date: 20220613
Implantable Lead Implant Date: 20220613
Implantable Lead Location: 753859
Implantable Lead Location: 753860
Implantable Lead Model: 7841
Implantable Lead Model: 7842
Implantable Lead Serial Number: 1074164
Implantable Lead Serial Number: 1156308
Implantable Pulse Generator Implant Date: 20220613
Lead Channel Impedance Value: 524 Ohm
Lead Channel Impedance Value: 600 Ohm
Lead Channel Pacing Threshold Amplitude: 0.5 V
Lead Channel Pacing Threshold Amplitude: 0.8 V
Lead Channel Pacing Threshold Pulse Width: 0.4 ms
Lead Channel Pacing Threshold Pulse Width: 0.4 ms
Lead Channel Setting Pacing Amplitude: 2 V
Lead Channel Setting Pacing Amplitude: 2 V
Lead Channel Setting Pacing Pulse Width: 0.4 ms
Lead Channel Setting Sensing Sensitivity: 2.5 mV
Pulse Gen Serial Number: 994472
Zone Setting Status: 755011

## 2022-10-24 DIAGNOSIS — C61 Malignant neoplasm of prostate: Secondary | ICD-10-CM | POA: Diagnosis not present

## 2022-10-24 DIAGNOSIS — R932 Abnormal findings on diagnostic imaging of liver and biliary tract: Secondary | ICD-10-CM | POA: Diagnosis not present

## 2022-10-24 DIAGNOSIS — R310 Gross hematuria: Secondary | ICD-10-CM | POA: Diagnosis not present

## 2022-10-24 DIAGNOSIS — N3289 Other specified disorders of bladder: Secondary | ICD-10-CM | POA: Diagnosis not present

## 2022-10-26 DIAGNOSIS — Z961 Presence of intraocular lens: Secondary | ICD-10-CM | POA: Diagnosis not present

## 2022-10-26 DIAGNOSIS — H40053 Ocular hypertension, bilateral: Secondary | ICD-10-CM | POA: Diagnosis not present

## 2022-10-31 NOTE — Progress Notes (Signed)
Remote pacemaker transmission.   

## 2022-12-07 ENCOUNTER — Telehealth: Payer: Self-pay | Admitting: Internal Medicine

## 2022-12-07 NOTE — Telephone Encounter (Signed)
Pt is requesting a callback regarding Power share. Please advise

## 2022-12-08 ENCOUNTER — Encounter: Payer: Self-pay | Admitting: Internal Medicine

## 2022-12-08 NOTE — Telephone Encounter (Signed)
Spoke with pt who is requesting to send a device transmission to contain his most recent information regarding device as he has an appointment scheduled 11/12 with Dr Gary Fleet, [facility] for possible ablation.  Pt advised will forward to device clinic for further assistance on when to send transmission and how to best obtain the information for his upcoming appointment.  Pt verbalizes understanding and thanked Charity fundraiser for the call.

## 2022-12-08 NOTE — Telephone Encounter (Signed)
See my chart message.   Patient is to reach out on 11/11 to send a manual transmission and provide his email.  Once provides email and transmission, I can send him a pdf copy of his report.

## 2022-12-13 NOTE — Telephone Encounter (Signed)
Pt received PDF

## 2022-12-14 DIAGNOSIS — I4719 Other supraventricular tachycardia: Secondary | ICD-10-CM | POA: Diagnosis not present

## 2022-12-15 DIAGNOSIS — I452 Bifascicular block: Secondary | ICD-10-CM | POA: Diagnosis not present

## 2022-12-15 DIAGNOSIS — I459 Conduction disorder, unspecified: Secondary | ICD-10-CM | POA: Diagnosis not present

## 2023-01-10 DIAGNOSIS — E349 Endocrine disorder, unspecified: Secondary | ICD-10-CM | POA: Diagnosis not present

## 2023-01-10 DIAGNOSIS — R310 Gross hematuria: Secondary | ICD-10-CM | POA: Diagnosis not present

## 2023-01-10 DIAGNOSIS — C61 Malignant neoplasm of prostate: Secondary | ICD-10-CM | POA: Diagnosis not present

## 2023-01-13 LAB — CUP PACEART REMOTE DEVICE CHECK
Battery Remaining Longevity: 156 mo
Battery Remaining Percentage: 100 %
Brady Statistic RA Percent Paced: 0 %
Brady Statistic RV Percent Paced: 0 %
Date Time Interrogation Session: 20241211041100
Implantable Lead Connection Status: 753985
Implantable Lead Connection Status: 753985
Implantable Lead Implant Date: 20220613
Implantable Lead Implant Date: 20220613
Implantable Lead Location: 753859
Implantable Lead Location: 753860
Implantable Lead Model: 7841
Implantable Lead Model: 7842
Implantable Lead Serial Number: 1074164
Implantable Lead Serial Number: 1156308
Implantable Pulse Generator Implant Date: 20220613
Lead Channel Impedance Value: 523 Ohm
Lead Channel Impedance Value: 589 Ohm
Lead Channel Pacing Threshold Amplitude: 0.6 V
Lead Channel Pacing Threshold Amplitude: 0.7 V
Lead Channel Pacing Threshold Pulse Width: 0.4 ms
Lead Channel Pacing Threshold Pulse Width: 0.4 ms
Lead Channel Setting Pacing Amplitude: 2 V
Lead Channel Setting Pacing Amplitude: 2 V
Lead Channel Setting Pacing Pulse Width: 0.4 ms
Lead Channel Setting Sensing Sensitivity: 2.5 mV
Pulse Gen Serial Number: 994472
Zone Setting Status: 755011

## 2023-01-16 ENCOUNTER — Ambulatory Visit (INDEPENDENT_AMBULATORY_CARE_PROVIDER_SITE_OTHER): Payer: Medicare HMO

## 2023-01-16 DIAGNOSIS — I480 Paroxysmal atrial fibrillation: Secondary | ICD-10-CM

## 2023-01-16 DIAGNOSIS — R002 Palpitations: Secondary | ICD-10-CM

## 2023-02-13 ENCOUNTER — Encounter (HOSPITAL_COMMUNITY): Payer: Self-pay | Admitting: Emergency Medicine

## 2023-02-13 ENCOUNTER — Other Ambulatory Visit: Payer: Self-pay

## 2023-02-13 ENCOUNTER — Emergency Department (HOSPITAL_COMMUNITY)
Admission: EM | Admit: 2023-02-13 | Discharge: 2023-02-13 | Discharge status: Against Medical Advice | Payer: Medicare HMO | Attending: Emergency Medicine | Admitting: Emergency Medicine

## 2023-02-13 DIAGNOSIS — Z7901 Long term (current) use of anticoagulants: Secondary | ICD-10-CM | POA: Insufficient documentation

## 2023-02-13 DIAGNOSIS — Z5321 Procedure and treatment not carried out due to patient leaving prior to being seen by health care provider: Secondary | ICD-10-CM | POA: Diagnosis not present

## 2023-02-13 DIAGNOSIS — R040 Epistaxis: Secondary | ICD-10-CM | POA: Insufficient documentation

## 2023-02-13 LAB — CBC
HCT: 39.8 % (ref 39.0–52.0)
Hemoglobin: 13.2 g/dL (ref 13.0–17.0)
MCH: 30.6 pg (ref 26.0–34.0)
MCHC: 33.2 g/dL (ref 30.0–36.0)
MCV: 92.1 fL (ref 80.0–100.0)
Platelets: 180 10*3/uL (ref 150–400)
RBC: 4.32 MIL/uL (ref 4.22–5.81)
RDW: 14.6 % (ref 11.5–15.5)
WBC: 4.7 10*3/uL (ref 4.0–10.5)
nRBC: 0 % (ref 0.0–0.2)

## 2023-02-13 LAB — PROTIME-INR
INR: 1.1 (ref 0.8–1.2)
Prothrombin Time: 14.7 s (ref 11.4–15.2)

## 2023-02-13 MED ORDER — OXYMETAZOLINE HCL 0.05 % NA SOLN: 1.0000 | Freq: Once | NASAL | Status: DC

## 2023-02-13 NOTE — ED Notes (Signed)
 Patient states he does not want to wait any longer- pulled OTF

## 2023-02-13 NOTE — ED Provider Triage Note (Signed)
 Emergency Medicine Provider Triage Evaluation Note  Kevin Castro , a 78 y.o. male  was evaluated in triage.  Pt complains of epistaxis.  States same began this morning.  Bleeding was from his right nare.  Bleeding stopped prior to arrival.  States he was bleeding a significant amount.  He is on Xarelto .  Review of Systems  Positive:  Negative:   Physical Exam  BP 132/68   Pulse 60   Temp 97.6 F (36.4 C) (Oral)   Resp 16   Ht 5' 10 (1.778 m)   Wt 84.4 kg   SpO2 100%   BMI 26.69 kg/m  Gen:   Awake, no distress   Resp:  Normal effort  MSK:   Moves extremities without difficulty  Other:  Dried blood present in the right nare.  No active bleeding present in the nares or oropharynx.  Medical Decision Making  Medically screening exam initiated at 12:41 PM.  Appropriate orders placed.  Kevin Castro was informed that the remainder of the evaluation will be completed by another provider, this initial triage assessment does not replace that evaluation, and the importance of remaining in the ED until their evaluation is complete.  Workup initiated   Nikoloz Huy A, PA-C 02/13/23 1242

## 2023-02-13 NOTE — ED Triage Notes (Signed)
 Pt via POV c/o heavy nosebleed since around 0830am. Takes thinners and has been coughing up clots. Bleeding has currently stopped but pt is concerned that it will restart.

## 2023-02-20 NOTE — Progress Notes (Signed)
Remote pacemaker transmission.   

## 2023-03-31 DIAGNOSIS — I4719 Other supraventricular tachycardia: Secondary | ICD-10-CM | POA: Diagnosis not present

## 2023-03-31 DIAGNOSIS — I517 Cardiomegaly: Secondary | ICD-10-CM | POA: Diagnosis not present

## 2023-04-02 DIAGNOSIS — I517 Cardiomegaly: Secondary | ICD-10-CM | POA: Diagnosis not present

## 2023-04-09 NOTE — Progress Notes (Unsigned)
  Electrophysiology Office Note:   ID:  Kevin Castro, Kevin Castro 09-Jan-1946, MRN [id number]  Primary Cardiologist: None Electrophysiologist: Maurice Small, MD (From Dr. Graciela Husbands) {Click to update primary MD,subspecialty MD or APP then REFRESH:1}    History of Present Illness:   Kevin Castro is a 78 y.o. male with h/o sycnope and trifascicular block s/p PPM,  PAF and Atrial flutter (recurrent) s/p ablation as below, seen today for routine electrophysiology followup.   Since last being seen in our clinic the patient reports doing ***.  he denies chest pain, palpitations, dyspnea, PND, orthopnea, nausea, vomiting, dizziness, syncope, edema, weight gain, or early satiety.   Review of systems complete and found to be negative unless listed in HPI.   EP Information / Studies Reviewed:    EKG is ordered today. Personal review as below.       PPM Interrogation-  reviewed in detail today,  See PACEART report.  Arrhythmia/Device History [employer] Dual Chamber PPM 07/2020 for syncope and Trifascicular block  S/p PVI and CTI 10/2015   Physical Exam:   VS:  There were no vitals taken for this visit.   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 Gary Fleet 04/21/2023  {Click here to  Review PMH, Prob List, Meds, Allergies, SHx, FHx  :1}   Disposition:   Follow up with {EPPROVIDERS:28135} {EPFOLLOW UP:28173}  Signed, Graciella Freer, PA-C

## 2023-04-10 ENCOUNTER — Ambulatory Visit: Payer: Medicare HMO | Attending: Student | Admitting: Student

## 2023-04-10 ENCOUNTER — Encounter: Payer: Self-pay | Admitting: Student

## 2023-04-10 VITALS — BP 100/62 | HR 74 | Ht 70.0 in | Wt 194.0 lb

## 2023-04-10 DIAGNOSIS — R002 Palpitations: Secondary | ICD-10-CM | POA: Diagnosis not present

## 2023-04-10 DIAGNOSIS — I453 Trifascicular block: Secondary | ICD-10-CM

## 2023-04-10 DIAGNOSIS — I480 Paroxysmal atrial fibrillation: Secondary | ICD-10-CM

## 2023-04-10 DIAGNOSIS — R55 Syncope and collapse: Secondary | ICD-10-CM | POA: Diagnosis not present

## 2023-04-10 DIAGNOSIS — E291 Testicular hypofunction: Secondary | ICD-10-CM | POA: Diagnosis not present

## 2023-04-10 DIAGNOSIS — Z950 Presence of cardiac pacemaker: Secondary | ICD-10-CM

## 2023-04-10 DIAGNOSIS — I4892 Unspecified atrial flutter: Secondary | ICD-10-CM

## 2023-04-10 LAB — CUP PACEART INCLINIC DEVICE CHECK
Date Time Interrogation Session: 20250310084717
Implantable Lead Connection Status: 753985
Implantable Lead Connection Status: 753985
Implantable Lead Implant Date: 20220613
Implantable Lead Implant Date: 20220613
Implantable Lead Location: 753859
Implantable Lead Location: 753860
Implantable Lead Model: 7841
Implantable Lead Model: 7842
Implantable Lead Serial Number: 1074164
Implantable Lead Serial Number: 1156308
Implantable Pulse Generator Implant Date: 20220613
Lead Channel Impedance Value: 528 Ohm
Lead Channel Impedance Value: 611 Ohm
Lead Channel Pacing Threshold Amplitude: 0.7 V
Lead Channel Pacing Threshold Amplitude: 0.7 V
Lead Channel Pacing Threshold Pulse Width: 0.4 ms
Lead Channel Pacing Threshold Pulse Width: 0.4 ms
Lead Channel Sensing Intrinsic Amplitude: 2.9 mV
Lead Channel Sensing Intrinsic Amplitude: 20.8 mV
Lead Channel Setting Pacing Amplitude: 2 V
Lead Channel Setting Pacing Amplitude: 2 V
Lead Channel Setting Pacing Pulse Width: 0.4 ms
Lead Channel Setting Sensing Sensitivity: 2.5 mV
Pulse Gen Serial Number: 994472
Zone Setting Status: 755011

## 2023-04-10 NOTE — Patient Instructions (Signed)
Medication Instructions:  Your physician recommends that you continue on your current medications as directed. Please refer to the Current Medication list given to you today.  *If you need a refill on your cardiac medications before your next appointment, please call your pharmacy*  Lab Work: None ordered If you have labs (blood work) drawn today and your tests are completely normal, you will receive your results only by: MyChart Message (if you have MyChart) OR A paper copy in the mail If you have any lab test that is abnormal or we need to change your treatment, we will call you to review the results.  Follow-Up: At [facility], you and your health needs are our priority.  As part of our continuing mission to provide you with exceptional heart care, we have created designated Provider Care Teams.  These Care Teams include your primary Cardiologist (physician) and Advanced Practice Providers (APPs -  Physician Assistants and Nurse Practitioners) who all work together to provide you with the care you need, when you need it.  Your next appointment:   6 month(s)  Provider:   York Pellant, MD or [facility]" Lanna Poche, [place]

## 2023-04-19 DIAGNOSIS — H524 Presbyopia: Secondary | ICD-10-CM | POA: Diagnosis not present

## 2023-04-19 DIAGNOSIS — Z961 Presence of intraocular lens: Secondary | ICD-10-CM | POA: Diagnosis not present

## 2023-04-19 DIAGNOSIS — H40053 Ocular hypertension, bilateral: Secondary | ICD-10-CM | POA: Diagnosis not present

## 2023-04-19 DIAGNOSIS — D3132 Benign neoplasm of left choroid: Secondary | ICD-10-CM | POA: Diagnosis not present

## 2023-04-19 IMAGING — CT CT HEAD W/O CM
1 series · 16 of 30 positions shown, 20 images · non-contrast
Comparison: None.

CLINICAL DATA: Status post fall, hit RIGHT-side of head, episode of
syncope with headaches. Fall Ng by report during a running event on
05/30/2020.

EXAM:
CT HEAD WITHOUT CONTRAST
TECHNIQUE: Contiguous axial images were obtained from the base of the skull
through the vertex without intravenous contrast.

[Series 2: head w/(date) · axial · 0.49mm/px · z∈[-167,-2]mm · 16 of 37 slices shown, 20 images]
[im 2/37  brain]
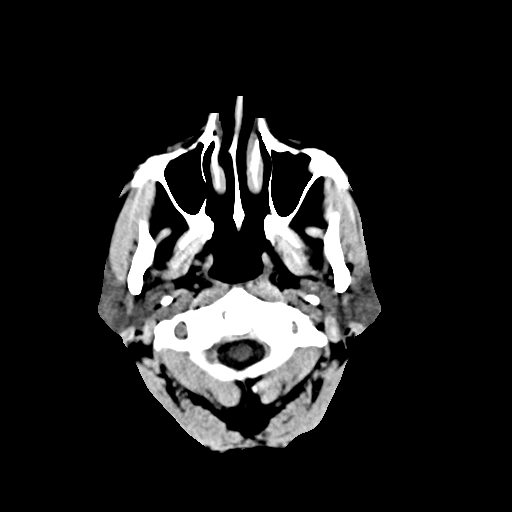
[im 2/37  bone]
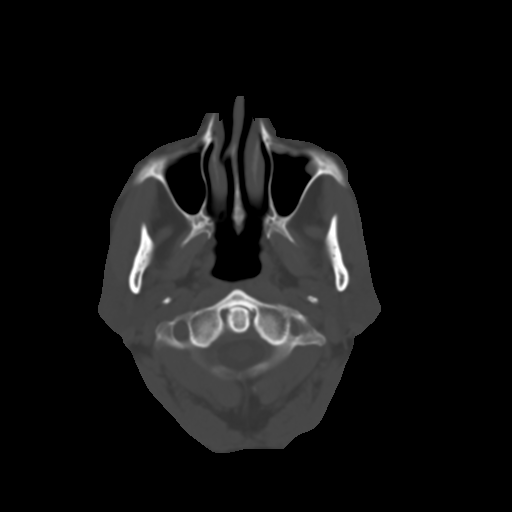
[im 4/37  brain]
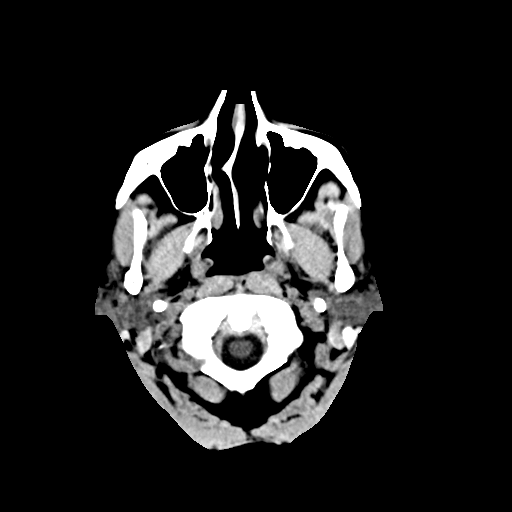
[im 7/37  brain]
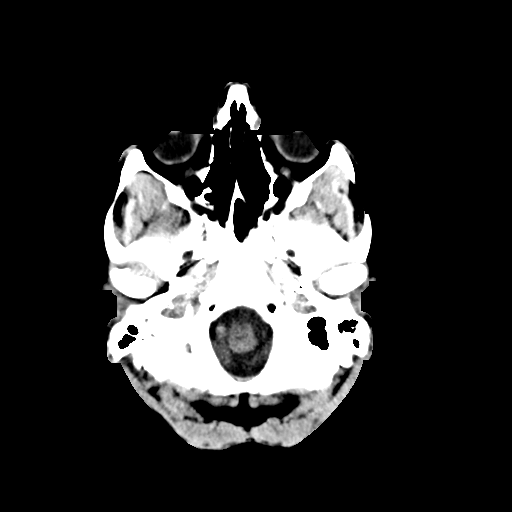
[im 9/37  brain]
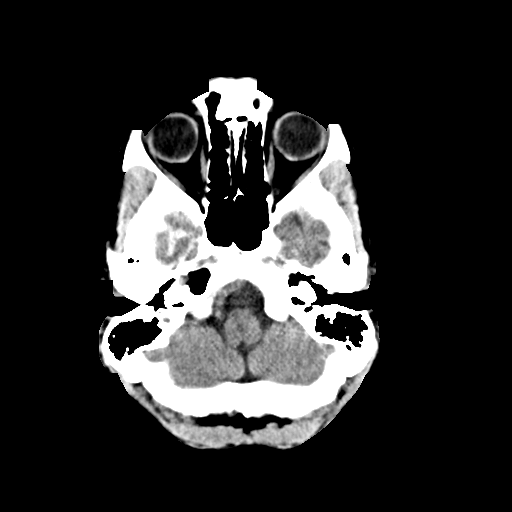
[im 10/37  brain]
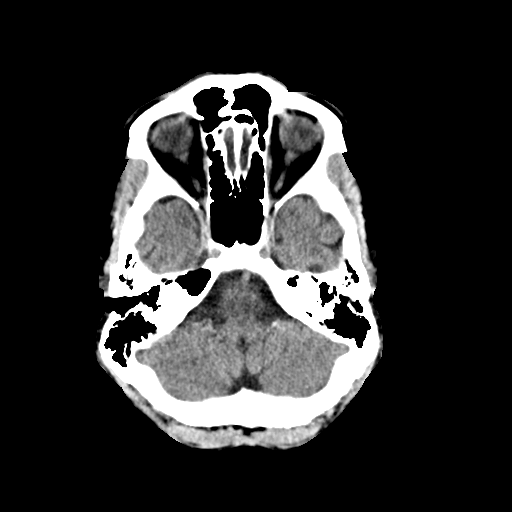
[im 10/37  bone]
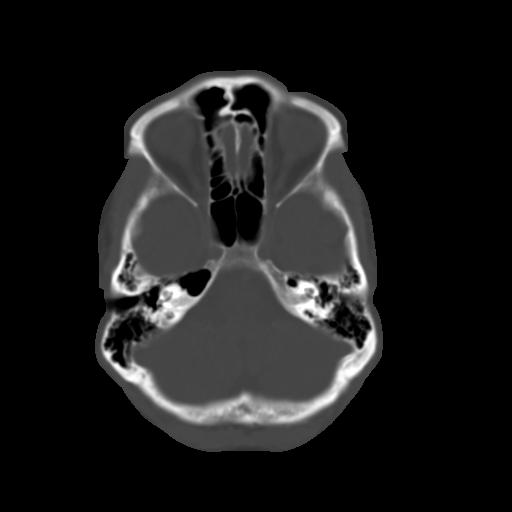
[im 13/37  brain]
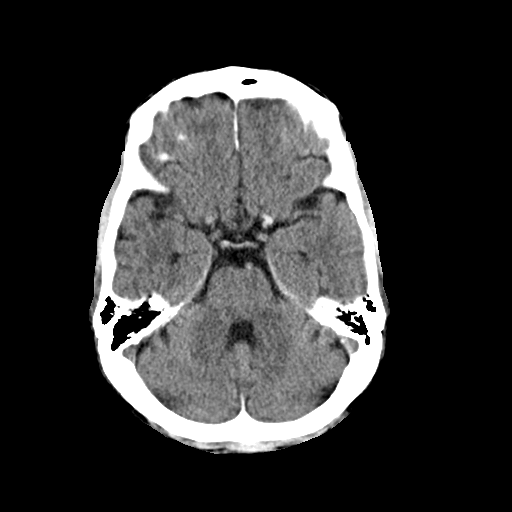
[im 15/37  brain]
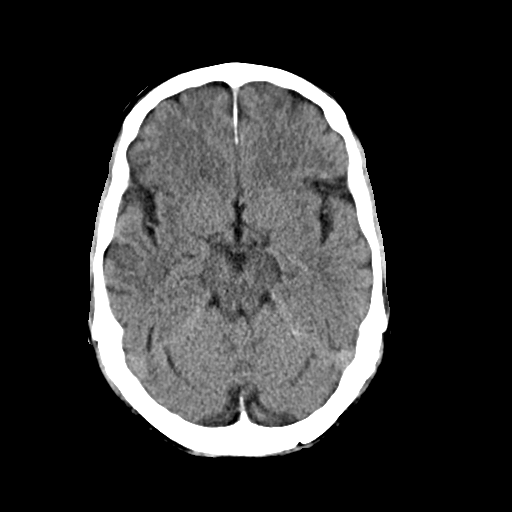
[im 18/37  brain]
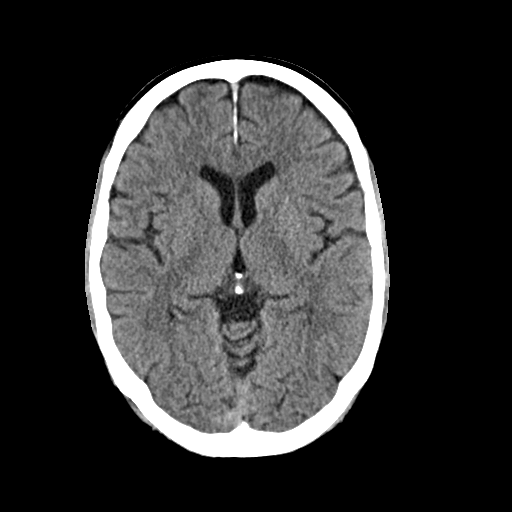
[im 19/37  brain]
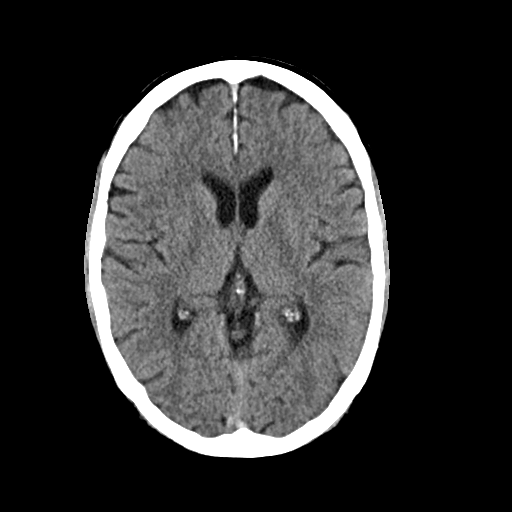
[im 19/37  bone]
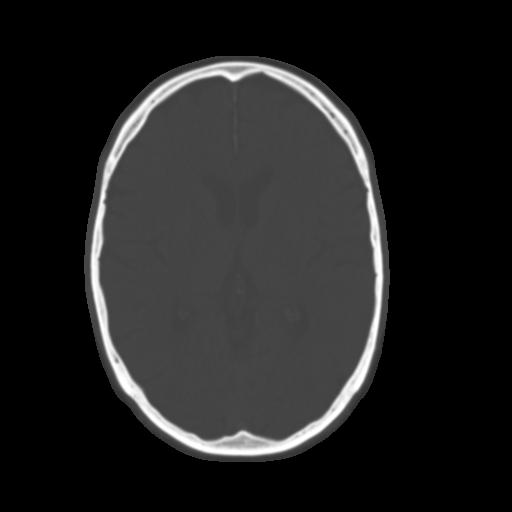
[im 22/37  brain]
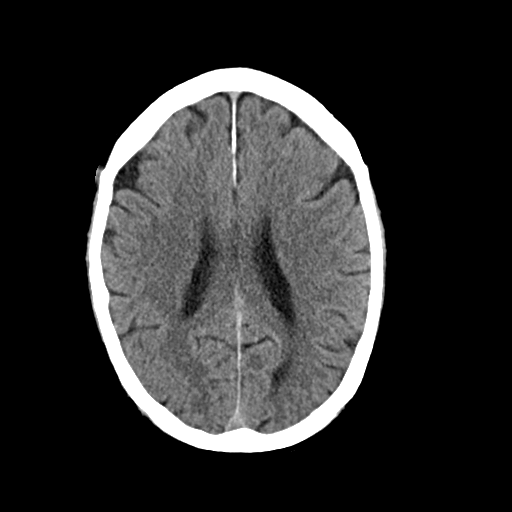
[im 24/37  brain]
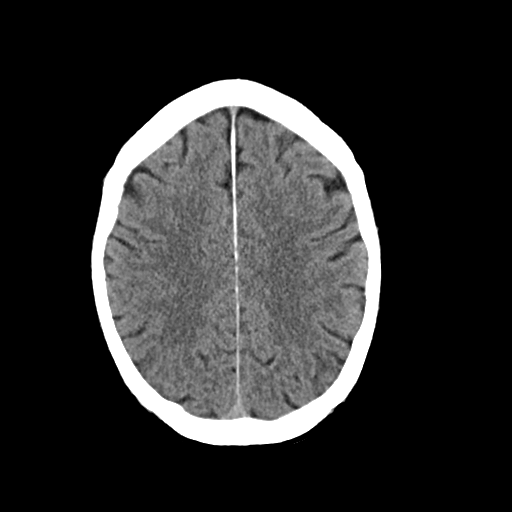
[im 27/37  brain]
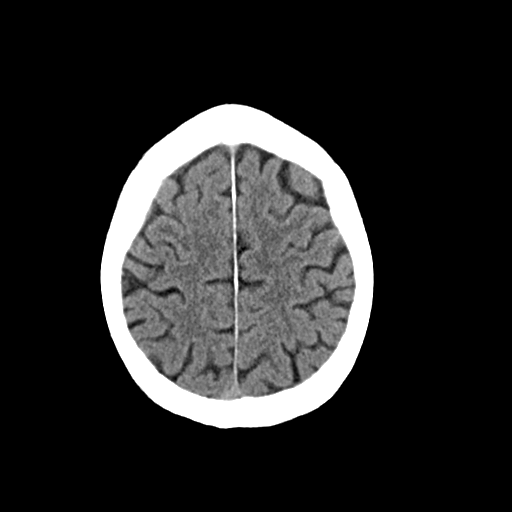
[im 28/37  brain]
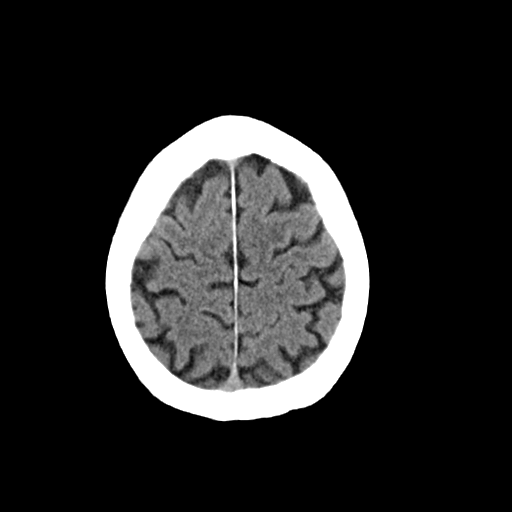
[im 28/37  bone]
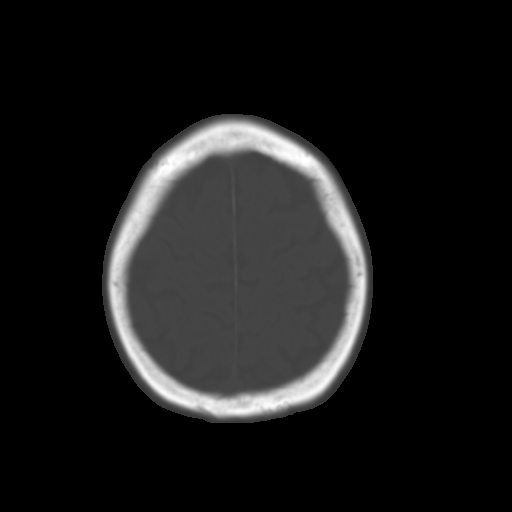
[im 30/37  brain]
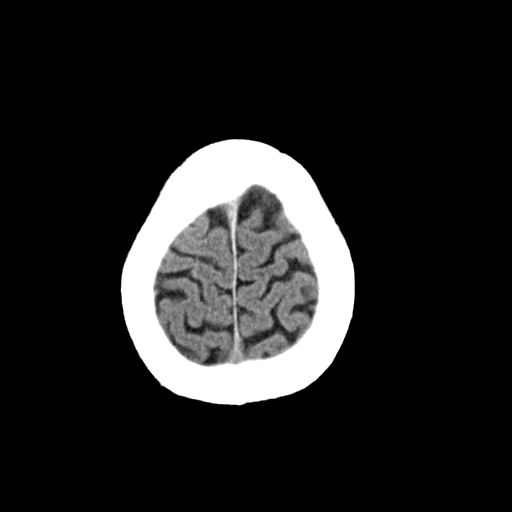
[im 33/37  brain]
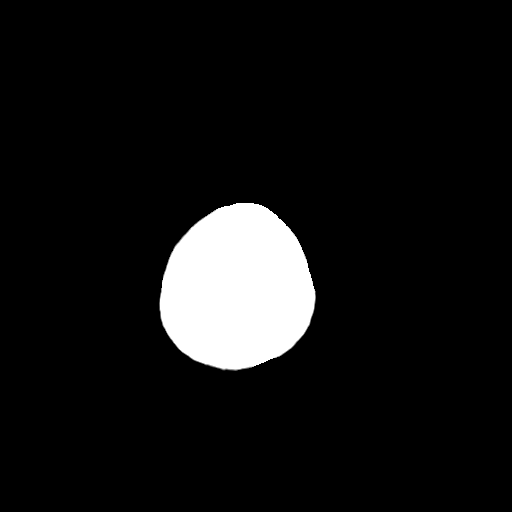
[im 35/37  brain]
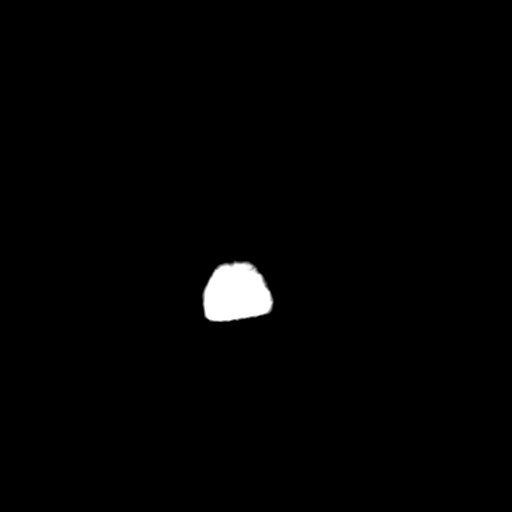

[16 of 30 positions shown; findings below may reference images not displayed]

FINDINGS: Brain: No evidence of acute infarction, hemorrhage, hydrocephalus,
extra-axial collection or mass lesion/mass effect. Mild atrophy.

Vascular: No hyperdense vessel or unexpected calcification.

Skull: Normal. Negative for fracture or focal lesion.

Sinuses/Orbits: Small mucous retention cysts or polyps in the LEFT
maxillary sinus. Visualized paranasal sinuses and orbits otherwise
unremarkable.

Other: None.
IMPRESSION: 1. No acute intracranial findings.
2. Mild generalized atrophy.

## 2023-04-21 DIAGNOSIS — I4719 Other supraventricular tachycardia: Secondary | ICD-10-CM | POA: Diagnosis not present

## 2023-04-24 DIAGNOSIS — I452 Bifascicular block: Secondary | ICD-10-CM | POA: Diagnosis not present

## 2023-05-29 ENCOUNTER — Telehealth: Payer: Self-pay | Admitting: Cardiovascular Disease

## 2023-05-29 NOTE — Telephone Encounter (Signed)
 Patient c/o Palpitations: STAT if patient c/o lightheadedness, shortness of breath, or chest pain  How long have you had palpitations/irregular HR/ Afib? Are you having the symptoms now? no  Are you currently experiencing lightheadedness, SOB or CP?  Lightheadedness (had to lay down during this time), neck, shoulder pain   Do you have a history of afib (atrial fibrillation) or irregular heart rhythm? yes  Have you checked your BP or HR? (document readings if available): no  Are you experiencing any other symptoms? no

## 2023-05-29 NOTE — Telephone Encounter (Signed)
 Spoke with patient and he states he was in a 4 mile race on Saturday and his HR went up to 180. He couldn't finish the race. He was dizzy, lightheaded, had arm and shoulder pain. EMS was called and EKG was done but he can not remember the vitals. HR stayed elevated for the rest of the night. Sunday HR was back in normal range. Asymptomatic today as well. HR in normal range.  He has appointment with provider on Wednesday. He would like a device check from Saturday

## 2023-05-31 ENCOUNTER — Ambulatory Visit: Attending: Cardiovascular Disease | Admitting: Cardiovascular Disease

## 2023-05-31 ENCOUNTER — Encounter: Payer: Self-pay | Admitting: Cardiovascular Disease

## 2023-05-31 VITALS — BP 116/70 | HR 58 | Ht 70.0 in | Wt 194.0 lb

## 2023-05-31 DIAGNOSIS — I480 Paroxysmal atrial fibrillation: Secondary | ICD-10-CM

## 2023-05-31 LAB — CUP PACEART INCLINIC DEVICE CHECK
Date Time Interrogation Session: 20250430090505
Implantable Lead Connection Status: 753985
Implantable Lead Connection Status: 753985
Implantable Lead Implant Date: 20220613
Implantable Lead Implant Date: 20220613
Implantable Lead Location: 753859
Implantable Lead Location: 753860
Implantable Lead Model: 7841
Implantable Lead Model: 7842
Implantable Lead Serial Number: 1074164
Implantable Lead Serial Number: 1156308
Implantable Pulse Generator Implant Date: 20220613
Lead Channel Impedance Value: 530 Ohm
Lead Channel Impedance Value: 587 Ohm
Lead Channel Pacing Threshold Amplitude: 0.7 V
Lead Channel Pacing Threshold Amplitude: 0.8 V
Lead Channel Pacing Threshold Pulse Width: 0.4 ms
Lead Channel Pacing Threshold Pulse Width: 0.4 ms
Lead Channel Sensing Intrinsic Amplitude: 19.8 mV
Lead Channel Sensing Intrinsic Amplitude: 3.5 mV
Lead Channel Setting Pacing Amplitude: 2 V
Lead Channel Setting Pacing Amplitude: 2 V
Lead Channel Setting Pacing Pulse Width: 0.4 ms
Lead Channel Setting Sensing Sensitivity: 2.5 mV
Pulse Gen Serial Number: 994472
Zone Setting Status: 755011

## 2023-05-31 NOTE — Patient Instructions (Signed)
 Medication Instructions:  Your physician recommends that you continue on your current medications as directed. Please refer to the Current Medication list given to you today. *If you need a refill on your cardiac medications before your next appointment, please call your pharmacy*  Follow-Up: At [facility], you and your health needs are our priority.  As part of our continuing mission to provide you with exceptional heart care, our providers are all part of one team.  This team includes your primary Cardiologist (physician) and Advanced Practice Providers or APPs (Physician Assistants and Nurse Practitioners) who all work together to provide you with the care you need, when you need it.  Your next appointment:   6 month(s)  Provider:   Marlane Silver, MD

## 2023-05-31 NOTE — Progress Notes (Signed)
 Electrophysiology Office Note:    Date:  05/31/2023   ID:  Kevin Castro, DOB 12-Jan-1946, MRN [id number]  PCP:  Imelda Man, MD   [facility] Providers Cardiologist:  None Electrophysiologist:  Efraim Grange, MD     Referring MD: Imelda Man, MD   History of Present Illness:    Kevin Castro is a 78 y.o. male with a hx listed below, significant for PAF s/p PVI 2017, [employer] pacemaker referred for arrhythmia management.  He saw Dr. Rodolfo Clan in January. Since his ablation, he has had episodes of atrial flutter. In June 22, he had syncope with what appears to be a post-conversion pause. The pacemaker was then placed. One episode of flutter was pace terminated in October 2023. Device interrogation last month showed additional episodes consistent with flutter. He noted decreased exercise tolerance that correlated with flutter episodes.  At his last visit, we discussed potential for mapping and ablation of atypical atrial flutter.  He was again seen by Dr. Rodolfo Clan and continued with flecainide  pill in the pocket therapy.  He was interested in PFA and underwent a procedure by Dr. Phill Brazil at [facility].  I reviewed Dr. Delbert Feathers note.  He was able to do induce an arrhythmia most typical of CTI dependent flutter though the entire cycle length of the flutter was not captured on the right atrium.  The flutter line from the prior ablation had breakthrough.  The PVI and posterior wall remained electrically isolated and multiple indicators suggested that the rhythm was not originating from the left side.  Additional ablation was also performed on the interatrial septum.  He was largely doing well since the ablation but on April 26 had recurrence of symptoms during a trail run.  Device interrogation today shows frequent and sustained episodes with atrial high rates with a cycle length ranging from about 332 to 360 ms, often times with one-to-one conduction.  He did not have his  flecainide  but did take additional verapamil .  EKGs/Labs/Other Studies Reviewed Today:    Pacemaker interrogation was performed today. I reviewed the event episodes. He continues to have flutter episodes, one lasted about 20 hours.  EKG:  Last EKG results: today sinus rhythm RBBB, LAFB   Recent Labs: 06/25/2022: B Natriuretic Peptide 85.1; BUN 20; Creatinine, Ser 0.96; Magnesium 1.9; Potassium 4.5; Sodium 139 02/13/2023: Hemoglobin 13.2; Platelets 180     Physical Exam:    VS:  BP 116/70   Pulse (!) 58   Ht 5\' 10"  (1.778 m)   Wt 194 lb (88 kg)   SpO2 97%   BMI 27.84 kg/m     Wt Readings from Last 3 Encounters:  05/31/23 194 lb (88 kg)  04/10/23 194 lb (88 kg)  02/13/23 186 lb (84.4 kg)     GEN: Well nourished, well developed in no acute distress CARDIAC: RRR, no murmurs, rubs, gallops RESPIRATORY:  Normal work of breathing MUSCULOSKELETAL: no edema    ASSESSMENT & PLAN:    Atrial flutter:  I have not seen the rhythm on ECG but suspect he has an atypical flutter in the setting of prior PVI.  He is symptomatic with fatigue, and his ventricular rates are sometimes not well controlled.  S/p mapping and ablation at [facility] With recurrence of suspected flutter on 05/27/2023 with atrial high rates at a cycle of 330-360 msec, sometimes with 1:1 conduction We will continue to monitor for now as he recovers from his ablation.  He will keep his pill in the  pocket flecainide  at hand.  Atrial fibrillation History of AF ablation by cryoballoon in 2017  History of post-conversion pause:  [employer] dual chamber pacemaker:  Placed for syncope and trifascicular block in June 2022  I reviewed today's device interrogation.  See Paceart for details  normal function He is not device dependent         Medication Adjustments/Labs and Tests Ordered: Current medicines are reviewed at length with the patient today.  Concerns regarding medicines are outlined above.   Orders Placed This Encounter  Procedures   EKG 12-Lead   No orders of the defined types were placed in this encounter.    Signed, Efraim Grange, MD  05/31/2023 8:47 AM    [facility]

## 2023-06-01 ENCOUNTER — Telehealth: Payer: Self-pay | Admitting: Cardiovascular Disease

## 2023-06-01 NOTE — Telephone Encounter (Signed)
 Patient c/o Palpitations:  STAT if patient reporting lightheadedness, shortness of breath, or chest pain  How long have you had palpitations/irregular HR/ Afib? Are you having the symptoms now?   Yes  Are you currently experiencing lightheadedness, SOB or CP?   Lightheadedness  Do you have a history of afib (atrial fibrillation) or irregular heart rhythm?   Yes  Have you checked your BP or HR? (document readings if available  Not available   Are you experiencing any other symptoms?   Caller (Dr. Robinette Chou) stated patient had an ablation a couple of weeks ago.

## 2023-06-01 NOTE — Telephone Encounter (Signed)
 Dr. Grandville Lax was on a bike ride with pt who became slightly symptomatic with his afib (some light headedness).  Not sure of HR but asking if pt can take Flecainide  to see if would help.  Made aware of possible breakthrough afib post ablation. Advised ok for pt to take "pill in the pocket" Flecainide  as is on his medication list.  They are agreeable to plan and will call back if pt remains out of rhythm for more than 24-48 hours.  Forwarding to MD for his FYI

## 2023-06-02 NOTE — Telephone Encounter (Signed)
 Spoke with patient, currently back into NSR. Instructed patient to obtain an EKG if he believes he is sustaining AF. Patient has kardia mobile at home. No needs at this time

## 2023-06-02 NOTE — Telephone Encounter (Signed)
 Pt returning call, requesting cb

## 2023-06-02 NOTE — Telephone Encounter (Signed)
 Attempted to contact patient, left message to call our office back

## 2023-06-06 ENCOUNTER — Encounter: Payer: Self-pay | Admitting: Cardiovascular Disease

## 2023-06-07 ENCOUNTER — Telehealth: Payer: Self-pay | Admitting: Cardiovascular Disease

## 2023-06-07 NOTE — Telephone Encounter (Signed)
 Spoke with patient, ablation completed at [facility] and aware of breakthru episodes. Educated patient on blinding period following ablation and to continue medication as prescribed. Follow up appt with EP at [facility] already scheduled. Would like to continue to be followed by [facility], planning to only keep up with [facility] until follow up from ablation complete.

## 2023-06-07 NOTE — Telephone Encounter (Signed)
 Pt returning call from nurse Jessi that he received this morning. Please advise

## 2023-06-07 NOTE — Telephone Encounter (Signed)
 Kevin Castro

## 2023-06-07 NOTE — Telephone Encounter (Signed)
 Spoke with patient, he states these episodes of tachycardia has happened the past several times he has exercised. Rates up to 180 but does recover well when at rest. Patient will send a remote transmission for device team to review event from yesterday. Patient unable to catch it on an EKG thus far

## 2023-06-13 ENCOUNTER — Encounter: Payer: Self-pay | Admitting: Cardiovascular Disease

## 2023-06-13 DIAGNOSIS — I480 Paroxysmal atrial fibrillation: Secondary | ICD-10-CM

## 2023-06-14 MED ORDER — RIVAROXABAN 20 MG PO TABS: 20.0000 mg | ORAL_TABLET | Freq: Every day | ORAL | 1 refills | Status: DC

## 2023-06-14 NOTE — Telephone Encounter (Signed)
 Prescription refill request for Xarelto  received.  Indication: PAF Last office visit: 05/31/23  A Mealor MD Weight: 88kg Age: 78 Scr: 1.01 on 04/21/23  Epic CrCl: 78.66  Based on above findings Xarelto  20mg  daily is the appropriate dose.  Refill approved.

## 2023-06-23 DIAGNOSIS — H524 Presbyopia: Secondary | ICD-10-CM | POA: Diagnosis not present

## 2023-06-23 DIAGNOSIS — H52223 Regular astigmatism, bilateral: Secondary | ICD-10-CM | POA: Diagnosis not present

## 2023-07-03 ENCOUNTER — Telehealth: Payer: Self-pay

## 2023-07-03 DIAGNOSIS — Z950 Presence of cardiac pacemaker: Secondary | ICD-10-CM

## 2023-07-03 MED ORDER — FLECAINIDE ACETATE 100 MG PO TABS: ORAL_TABLET | ORAL | 3 refills | Status: AC

## 2023-07-03 NOTE — Telephone Encounter (Signed)
 Patient is requesting refill on flecainide .

## 2023-07-03 NOTE — Telephone Encounter (Signed)
 Pt's medication was sent to pt's pharmacy as requested. Confirmation received.

## 2023-07-03 NOTE — Telephone Encounter (Signed)
 Alert received from [employer] for VT Episode occurred (V>A). Event occurred 5/28 @ 18:07, HR 186, V>A, 8 beats in duration, dirty break followed by >20 beats, duration 44sec NSVT event , HR 196, duration 15sec prior to above - route to triage.   Patient does not recall any symptoms but would like to check his calendar. States he is not at home but will call back when he returns home.

## 2023-07-03 NOTE — Telephone Encounter (Signed)
 Patient reports he was on a trail run exercising. Patient does not recall any symptoms.   Routing to Dr. Arlester Ladd to advise.

## 2023-07-04 NOTE — Telephone Encounter (Signed)
 Can someone contact patient for an apt. With Dr. Arlester Ladd.

## 2023-07-12 ENCOUNTER — Ambulatory Visit: Attending: Cardiovascular Disease | Admitting: Cardiovascular Disease

## 2023-07-12 ENCOUNTER — Encounter: Payer: Self-pay | Admitting: Cardiovascular Disease

## 2023-07-12 VITALS — BP 120/80 | HR 62 | Ht 70.0 in | Wt 191.6 lb

## 2023-07-12 DIAGNOSIS — I480 Paroxysmal atrial fibrillation: Secondary | ICD-10-CM | POA: Diagnosis not present

## 2023-07-12 DIAGNOSIS — I4729 Other ventricular tachycardia: Secondary | ICD-10-CM

## 2023-07-12 DIAGNOSIS — Z01812 Encounter for preprocedural laboratory examination: Secondary | ICD-10-CM

## 2023-07-12 NOTE — Progress Notes (Signed)
 Electrophysiology Office Note:    Date:  07/12/2023   ID:  Kevin Castro, DOB [birth year], MRN [id number]  PCP:  Imelda Man, MD   [facility] Providers Cardiologist:  None Electrophysiologist:  Efraim Grange, MD     Referring MD: Imelda Man, MD   History of Present Illness:    Kevin Castro is a 78 y.o. male with a hx listed below, significant for PAF s/p PVI 2017, [employer] pacemaker referred for arrhythmia management.  Since his ablation in 2017, he has had episodes of atrial flutter. In June 22, he had syncope with what appears to be a post-conversion pause. The pacemaker was then placed. One episode of flutter was pace terminated in October 2023. Device interrogation showed episodes of atrial flutter that correlated with decreased exercise tolerance.  We discussed potential for mapping and ablation of atypical atrial flutter.  He was again seen by Dr. Rodolfo Clan and continued with flecainide  pill in the pocket therapy.  He was interested in PFA and underwent a procedure by Dr. Phill Brazil at [facility].  I reviewed Dr. Delbert Feathers note.  He was able to do induce an arrhythmia most typical of CTI dependent flutter though the entire cycle length of the flutter was not captured on the right atrium.  The flutter line from the prior ablation had breakthrough.  The PVI and posterior wall remained electrically isolated and multiple indicators suggested that the rhythm was not originating from the left side.  Additional ablation was also performed on the interatrial septum.  He was largely doing well since the ablation but on April 26 had recurrence of symptoms during a trail run.  Device interrogation showed frequent and sustained episodes with atrial high rates with a cycle length ranging from about 332 to 360 ms, often times with one-to-one conduction.  He did not have his flecainide  but did take additional verapamil .  He is seen today as an urgent visit for device detected  ventricular tachycardia.  There was an episode of V>A detected by his device on May 28 at approximately 6 PM.  Patient noted he had likely just tarted a run at that time.  He does not recall having any lightheadedness, dizziness, chest pain.  EKGs/Labs/Other Studies Reviewed Today:    Pacemaker interrogation was performed today. I reviewed the event episodes. He continues to have flutter episodes, one lasted about 20 hours.  EKG:  Last EKG results: today sinus rhythm RBBB, LAFB   Recent Labs: 02/13/2023: Hemoglobin 13.2; Platelets 180     Physical Exam:    VS:  BP 120/80   Pulse 62   Ht 5' 10 (1.778 m)   Wt 191 lb 9.6 oz (86.9 kg)   SpO2 99%   BMI 27.49 kg/m     Wt Readings from Last 3 Encounters:  07/12/23 191 lb 9.6 oz (86.9 kg)  05/31/23 194 lb (88 kg)  04/10/23 194 lb (88 kg)     GEN: Well nourished, well developed in no acute distress CARDIAC: RRR, no murmurs, rubs, gallops RESPIRATORY:  Normal work of breathing MUSCULOSKELETAL: no edema    ASSESSMENT & PLAN:    Atrial flutter:  I have not seen the rhythm on ECG but suspect he has an atypical flutter in the setting of prior PVI.  He is symptomatic with fatigue, and his ventricular rates are sometimes not well controlled.  S/p mapping and ablation at [facility] With recurrence of suspected flutter on 05/27/2023 with atrial high rates at a cycle of  330-360 msec, sometimes with 1:1 conduction We will continue to monitor for now as he recovers from his ablation.  He will keep his pill in the pocket flecainide  at hand.  VT Device detected episodes, longest was 44 seconds on May 28 On my review, this was not a sustained episode; there were frequent runs from 4-20 beats, separated by single sinus beats He was asymptomatic Stress PET in 2022 showed reduced resting EF but no evidence of ischemia Will order cardiac MRI and coronary angiogram He has not had symptoms consistent with ischemia, but is worth noting that the  event likely occurred after starting a run  Atrial fibrillation History of AF ablation by cryoballoon in 2017  History of post-conversion pause:  [employer] dual chamber pacemaker:  Placed for syncope and trifascicular block in June 2022  I reviewed today's device interrogation.  See Paceart for details  normal function He is not device dependent         Medication Adjustments/Labs and Tests Ordered: Current medicines are reviewed at length with the patient today.  Concerns regarding medicines are outlined above.  Orders Placed This Encounter  Procedures   EKG 12-Lead   No orders of the defined types were placed in this encounter.    Signed, Efraim Grange, MD  07/12/2023 11:47 AM    [facility]

## 2023-07-12 NOTE — H&P (View-Only) (Signed)
 Electrophysiology Office Note:    Date:  07/12/2023   ID:  Kevin Castro, DOB [birth year], MRN [id number]  PCP:  Imelda Man, MD   [facility] Providers Cardiologist:  None Electrophysiologist:  Efraim Grange, MD     Referring MD: Imelda Man, MD   History of Present Illness:    Kevin Castro is a 78 y.o. male with a hx listed below, significant for PAF s/p PVI 2017, [employer] pacemaker referred for arrhythmia management.  Since his ablation in 2017, he has had episodes of atrial flutter. In June 22, he had syncope with what appears to be a post-conversion pause. The pacemaker was then placed. One episode of flutter was pace terminated in October 2023. Device interrogation showed episodes of atrial flutter that correlated with decreased exercise tolerance.  We discussed potential for mapping and ablation of atypical atrial flutter.  He was again seen by Dr. Rodolfo Clan and continued with flecainide  pill in the pocket therapy.  He was interested in PFA and underwent a procedure by Dr. Phill Brazil at [facility].  I reviewed Dr. Delbert Feathers note.  He was able to do induce an arrhythmia most typical of CTI dependent flutter though the entire cycle length of the flutter was not captured on the right atrium.  The flutter line from the prior ablation had breakthrough.  The PVI and posterior wall remained electrically isolated and multiple indicators suggested that the rhythm was not originating from the left side.  Additional ablation was also performed on the interatrial septum.  He was largely doing well since the ablation but on April 26 had recurrence of symptoms during a trail run.  Device interrogation showed frequent and sustained episodes with atrial high rates with a cycle length ranging from about 332 to 360 ms, often times with one-to-one conduction.  He did not have his flecainide  but did take additional verapamil .  He is seen today as an urgent visit for device detected  ventricular tachycardia.  There was an episode of V>A detected by his device on May 28 at approximately 6 PM.  Patient noted he had likely just tarted a run at that time.  He does not recall having any lightheadedness, dizziness, chest pain.  EKGs/Labs/Other Studies Reviewed Today:    Pacemaker interrogation was performed today. I reviewed the event episodes. He continues to have flutter episodes, one lasted about 20 hours.  EKG:  Last EKG results: today sinus rhythm RBBB, LAFB   Recent Labs: 02/13/2023: Hemoglobin 13.2; Platelets 180     Physical Exam:    VS:  BP 120/80   Pulse 62   Ht 5' 10 (1.778 m)   Wt 191 lb 9.6 oz (86.9 kg)   SpO2 99%   BMI 27.49 kg/m     Wt Readings from Last 3 Encounters:  07/12/23 191 lb 9.6 oz (86.9 kg)  05/31/23 194 lb (88 kg)  04/10/23 194 lb (88 kg)     GEN: Well nourished, well developed in no acute distress CARDIAC: RRR, no murmurs, rubs, gallops RESPIRATORY:  Normal work of breathing MUSCULOSKELETAL: no edema    ASSESSMENT & PLAN:    Atrial flutter:  I have not seen the rhythm on ECG but suspect he has an atypical flutter in the setting of prior PVI.  He is symptomatic with fatigue, and his ventricular rates are sometimes not well controlled.  S/p mapping and ablation at [facility] With recurrence of suspected flutter on 05/27/2023 with atrial high rates at a cycle of  330-360 msec, sometimes with 1:1 conduction We will continue to monitor for now as he recovers from his ablation.  He will keep his pill in the pocket flecainide  at hand.  VT Device detected episodes, longest was 44 seconds on May 28 On my review, this was not a sustained episode; there were frequent runs from 4-20 beats, separated by single sinus beats He was asymptomatic Stress PET in 2022 showed reduced resting EF but no evidence of ischemia Will order cardiac MRI and coronary angiogram He has not had symptoms consistent with ischemia, but is worth noting that the  event likely occurred after starting a run  Atrial fibrillation History of AF ablation by cryoballoon in 2017  History of post-conversion pause:  [employer] dual chamber pacemaker:  Placed for syncope and trifascicular block in June 2022  I reviewed today's device interrogation.  See Paceart for details  normal function He is not device dependent         Medication Adjustments/Labs and Tests Ordered: Current medicines are reviewed at length with the patient today.  Concerns regarding medicines are outlined above.  Orders Placed This Encounter  Procedures   EKG 12-Lead   No orders of the defined types were placed in this encounter.    Signed, Efraim Grange, MD  07/12/2023 11:47 AM    [facility]

## 2023-07-12 NOTE — Patient Instructions (Addendum)
 Medication Instructions:  Your physician recommends that you continue on your current medications as directed. Please refer to the Current Medication list given to you today. *If you need a refill on your cardiac medications before your next appointment, please call your pharmacy*  Lab Work: Bmet,cbc today If you have labs (blood work) drawn today and your tests are completely normal, you will receive your results only by: MyChart Message (if you have MyChart) OR A paper copy in the mail If you have any lab test that is abnormal or we need to change your treatment, we will call you to review the results.  Testing/Procedures: Cardiac MRI - someone will contact you to schedule this Your physician has requested that you have a cardiac MRI. Cardiac MRI uses a computer to create images of your heart as its beating, producing both still and moving pictures of your heart and major blood vessels. For further information please visit [email or web address]. Please follow the instruction sheet given to you today for more information.   Left Heart Cath Your physician has requested that you have a cardiac catheterization. Cardiac catheterization is used to diagnose and/or treat various heart conditions. Doctors may recommend this procedure for a number of different reasons. The most common reason is to evaluate chest pain. Chest pain can be a symptom of coronary artery disease (CAD), and cardiac catheterization can show whether plaque is narrowing or blocking your heart's arteries. This procedure is also used to evaluate the valves, as well as measure the blood flow and oxygen levels in different parts of your heart. For further information please visit [email or web address] Please follow instruction sheet, as given.   Follow-Up: At [facility], you and your health needs are our priority.  As part of our continuing mission to provide you with exceptional heart care, our providers are all part of one  team.  This team includes your primary Cardiologist (physician) and Advanced Practice Providers or APPs (Physician Assistants and Nurse Practitioners) who all work together to provide you with the care you need, when you need it.  Your next appointment:   4-6 weeks   Provider:   Marlane Silver, MD     [facility] A DEPT OF [facility] HEARTCARE AT [address] A DEPT OF THE [place] [address] [place] [address] Dept: [phone] Loc: [phone]  Kiren Mcisaac  07/12/2023  You are scheduled for a Cardiac Catheterization on Tuesday, June 17 with Dr. Peter Swaziland.  1. Please arrive at the [facility] (Main Entrance A) at [facility]: [address] [place], [place] [address] at 5:30 AM (This time is 2 hour(s) before your procedure to ensure your preparation).   Free valet parking service is available. You will check in at ADMITTING. The support person will be asked to wait in the waiting room.  It is OK to have someone drop you off and come back when you are ready to be discharged.    Special note: Every effort is made to have your procedure done on time. Please understand that emergencies sometimes delay scheduled procedures.  2. Diet: Do not eat solid foods after midnight.  The patient may have clear liquids until 5am upon the day of the procedure.  3. Labs: You will need to have blood drawn today bmet,cbc  4. Medication instructions in preparation for your procedure:   Hold Xarelto  2 days before cath     [facility] Sunday 6/15 Hold Monday  6/16        On the morning of your procedure, take your Aspirin 81 mg and any morning medicines NOT listed above.  You may use sips of water.  5. Plan to go home the same day, you will only stay overnight if medically necessary. 6. Bring a current list of your medications and current insurance cards. 7. You MUST have a responsible person to drive you home. 8. Someone MUST be with you  the first 24 hours after you arrive home or your discharge will be delayed. 9. Please wear clothes that are easy to get on and off and wear slip-on shoes.  Thank you for allowing us  to care for you!   -- [facility]

## 2023-07-13 LAB — CBC WITH DIFFERENTIAL/PLATELET
Basophils Absolute: 0 10*3/uL (ref 0.0–0.2)
Basos: 1 %
EOS (ABSOLUTE): 0.1 10*3/uL (ref 0.0–0.4)
Eos: 3 %
Hematocrit: 41.6 % (ref 37.5–51.0)
Hemoglobin: 14.1 g/dL (ref 13.0–17.7)
Immature Grans (Abs): 0 10*3/uL (ref 0.0–0.1)
Immature Granulocytes: 0 %
Lymphocytes Absolute: 0.9 10*3/uL (ref 0.7–3.1)
Lymphs: 26 %
MCH: 33.1 pg — ABNORMAL HIGH (ref 26.6–33.0)
MCHC: 33.9 g/dL (ref 31.5–35.7)
MCV: 98 fL — ABNORMAL HIGH (ref 79–97)
Monocytes Absolute: 0.4 10*3/uL (ref 0.1–0.9)
Monocytes: 11 %
Neutrophils Absolute: 2 10*3/uL (ref 1.4–7.0)
Neutrophils: 59 %
Platelets: 188 10*3/uL (ref 150–450)
RBC: 4.26 x10E6/uL (ref 4.14–5.80)
RDW: 13.3 % (ref 11.6–15.4)
WBC: 3.4 10*3/uL (ref 3.4–10.8)

## 2023-07-13 LAB — BASIC METABOLIC PANEL WITH GFR
BUN/Creatinine Ratio: 17 (ref 10–24)
BUN: 18 mg/dL (ref 8–27)
CO2: 24 mmol/L (ref 20–29)
Calcium: 10 mg/dL (ref 8.6–10.2)
Chloride: 101 mmol/L (ref 96–106)
Creatinine, Ser: 1.03 mg/dL (ref 0.76–1.27)
Glucose: 91 mg/dL (ref 70–99)
Potassium: 5 mmol/L (ref 3.5–5.2)
Sodium: 140 mmol/L (ref 134–144)
eGFR: 76 mL/min/{1.73_m2} (ref >59)

## 2023-07-14 ENCOUNTER — Ambulatory Visit: Payer: Self-pay | Admitting: Cardiovascular Disease

## 2023-07-17 ENCOUNTER — Ambulatory Visit (INDEPENDENT_AMBULATORY_CARE_PROVIDER_SITE_OTHER): Payer: Medicare HMO

## 2023-07-17 ENCOUNTER — Telehealth: Payer: Self-pay | Admitting: *Deleted

## 2023-07-17 DIAGNOSIS — I480 Paroxysmal atrial fibrillation: Secondary | ICD-10-CM

## 2023-07-17 DIAGNOSIS — I453 Trifascicular block: Secondary | ICD-10-CM

## 2023-07-17 LAB — CUP PACEART REMOTE DEVICE CHECK
Battery Remaining Longevity: 138 mo
Battery Remaining Percentage: 100 %
Brady Statistic RA Percent Paced: 1 %
Brady Statistic RV Percent Paced: 0 %
Date Time Interrogation Session: 20250616041100
Implantable Lead Connection Status: 753985
Implantable Lead Connection Status: 753985
Implantable Lead Implant Date: 20220613
Implantable Lead Implant Date: 20220613
Implantable Lead Location: 753859
Implantable Lead Location: 753860
Implantable Lead Model: 7841
Implantable Lead Model: 7842
Implantable Lead Serial Number: 1074164
Implantable Lead Serial Number: 1156308
Implantable Pulse Generator Implant Date: 20220613
Lead Channel Impedance Value: 524 Ohm
Lead Channel Impedance Value: 632 Ohm
Lead Channel Pacing Threshold Amplitude: 0.5 V
Lead Channel Pacing Threshold Amplitude: 0.9 V
Lead Channel Pacing Threshold Pulse Width: 0.4 ms
Lead Channel Pacing Threshold Pulse Width: 0.4 ms
Lead Channel Setting Pacing Amplitude: 2 V
Lead Channel Setting Pacing Amplitude: 2 V
Lead Channel Setting Pacing Pulse Width: 0.4 ms
Lead Channel Setting Sensing Sensitivity: 2.5 mV
Pulse Gen Serial Number: 994472
Zone Setting Status: 755011

## 2023-07-17 NOTE — Telephone Encounter (Signed)
 Cardiac Catheterization scheduled at [facility] for: Tuesday July 18, 2023 7:30 AM Arrival time [facility] Main Entrance A at: 5:30 AM  Nothing to eat after midnight prior to procedure, clear liquids until 5 AM day of procedure  Medication instructions: -Hold:  Xarelto -none 07/16/23 until post procedure  -Other usual morning medications can be taken with sips of water including aspirin 81 mg.  Plan to go home the same day, you will only stay overnight if medically necessary.  You must have responsible adult to drive you home.  Someone must be with you the first 24 hours after you arrive home.  Reviewed procedure instructions with patient.

## 2023-07-18 ENCOUNTER — Ambulatory Visit: Payer: Self-pay | Admitting: Cardiovascular Disease

## 2023-07-18 ENCOUNTER — Ambulatory Visit (HOSPITAL_COMMUNITY)
Admission: RE | Admit: 2023-07-18 | Discharge: 2023-07-18 | Disposition: A | Discharge status: Home / Self Care | Attending: Cardiology | Admitting: Cardiology

## 2023-07-18 ENCOUNTER — Encounter (HOSPITAL_COMMUNITY)
Admission: RE | Disposition: A | Discharge status: Home / Self Care | Payer: Self-pay | Source: Home / Self Care | Attending: Cardiology

## 2023-07-18 ENCOUNTER — Encounter (HOSPITAL_COMMUNITY): Payer: Self-pay | Admitting: Cardiology

## 2023-07-18 ENCOUNTER — Other Ambulatory Visit: Payer: Self-pay

## 2023-07-18 DIAGNOSIS — I4892 Unspecified atrial flutter: Secondary | ICD-10-CM | POA: Diagnosis not present

## 2023-07-18 DIAGNOSIS — I480 Paroxysmal atrial fibrillation: Secondary | ICD-10-CM | POA: Insufficient documentation

## 2023-07-18 DIAGNOSIS — Z950 Presence of cardiac pacemaker: Secondary | ICD-10-CM | POA: Diagnosis not present

## 2023-07-18 DIAGNOSIS — I472 Ventricular tachycardia, unspecified: Secondary | ICD-10-CM

## 2023-07-18 HISTORY — PX: LEFT HEART CATH AND CORONARY ANGIOGRAPHY: CATH118249

## 2023-07-18 SURGERY — LEFT HEART CATH AND CORONARY ANGIOGRAPHY
Anesthesia: LOCAL

## 2023-07-18 MED ORDER — HEPARIN SODIUM (PORCINE) 1000 UNIT/ML IJ SOLN
INTRAMUSCULAR | Status: AC
  Filled 2023-07-18: qty 10

## 2023-07-18 MED ORDER — ASPIRIN 81 MG PO CHEW: 81.0000 mg | CHEWABLE_TABLET | ORAL | Status: DC

## 2023-07-18 MED ORDER — SODIUM CHLORIDE 0.9 % WEIGHT BASED INFUSION
1.0000 mL/kg/h | INTRAVENOUS | Status: DC
Start: 2023-07-18 — End: 2023-07-18

## 2023-07-18 MED ORDER — VERAPAMIL HCL 2.5 MG/ML IV SOLN
INTRAVENOUS | Status: DC | PRN
  Administered 2023-07-18: 10 mL via INTRA_ARTERIAL

## 2023-07-18 MED ORDER — LIDOCAINE HCL (PF) 1 % IJ SOLN
INTRAMUSCULAR | Status: AC
  Filled 2023-07-18: qty 30

## 2023-07-18 MED ORDER — SODIUM CHLORIDE 0.9% FLUSH: 3.0000 mL | INTRAVENOUS | Status: DC | PRN

## 2023-07-18 MED ORDER — HEPARIN SODIUM (PORCINE) 1000 UNIT/ML IJ SOLN
INTRAMUSCULAR | Status: DC | PRN
  Administered 2023-07-18: 4500 [IU] via INTRAVENOUS

## 2023-07-18 MED ORDER — HEPARIN (PORCINE) IN NACL 1000-0.9 UT/500ML-% IV SOLN
INTRAVENOUS | Status: DC | PRN
  Administered 2023-07-18 (×2): 500 mL

## 2023-07-18 MED ORDER — MIDAZOLAM HCL 2 MG/2ML IJ SOLN
INTRAMUSCULAR | Status: AC
  Filled 2023-07-18: qty 2

## 2023-07-18 MED ORDER — SODIUM CHLORIDE 0.9 % IV SOLN: 250.0000 mL | INTRAVENOUS | Status: DC | PRN

## 2023-07-18 MED ORDER — FENTANYL CITRATE (PF) 100 MCG/2ML IJ SOLN
INTRAMUSCULAR | Status: AC
Start: 2023-07-18 — End: 2023-07-18
  Filled 2023-07-18: qty 2

## 2023-07-18 MED ORDER — SODIUM CHLORIDE 0.9 % WEIGHT BASED INFUSION: 3.0000 mL/kg/h | INTRAVENOUS | Status: AC

## 2023-07-18 MED ORDER — LIDOCAINE HCL (PF) 1 % IJ SOLN
INTRAMUSCULAR | Status: DC | PRN
  Administered 2023-07-18: 2 mL via INTRADERMAL

## 2023-07-18 MED ORDER — ACETAMINOPHEN 325 MG PO TABS: 650.0000 mg | ORAL_TABLET | ORAL | Status: DC | PRN

## 2023-07-18 MED ORDER — VERAPAMIL HCL 2.5 MG/ML IV SOLN
INTRAVENOUS | Status: AC
  Filled 2023-07-18: qty 2

## 2023-07-18 MED ORDER — MIDAZOLAM HCL 2 MG/2ML IJ SOLN
INTRAMUSCULAR | Status: DC | PRN
  Administered 2023-07-18: 1 mg via INTRAVENOUS

## 2023-07-18 MED ORDER — FENTANYL CITRATE (PF) 100 MCG/2ML IJ SOLN
INTRAMUSCULAR | Status: DC | PRN
  Administered 2023-07-18: 25 ug via INTRAVENOUS

## 2023-07-18 MED ORDER — ONDANSETRON HCL 4 MG/2ML IJ SOLN
4.0000 mg | Freq: Four times a day (QID) | INTRAMUSCULAR | Status: DC | PRN
Start: 2023-07-18 — End: 2023-07-18

## 2023-07-18 MED ORDER — IOHEXOL 350 MG/ML SOLN
INTRAVENOUS | Status: DC | PRN
  Administered 2023-07-18: 50 mL

## 2023-07-18 MED ORDER — SODIUM CHLORIDE 0.9% FLUSH: 3.0000 mL | Freq: Two times a day (BID) | INTRAVENOUS | Status: DC

## 2023-07-18 SURGICAL SUPPLY — 8 items
CATH 5FR JL3.5 JR4 ANG PIG MP (CATHETERS) IMPLANT
DEVICE RAD COMP TR BAND LRG (VASCULAR PRODUCTS) IMPLANT
GLIDESHEATH SLEND SS 6F .021 (SHEATH) IMPLANT
GUIDEWIRE INQWIRE 1.5J.035X260 (WIRE) IMPLANT
KIT SYRINGE INJ CVI SPIKEX1 (MISCELLANEOUS) IMPLANT
PACK CARDIAC CATHETERIZATION (CUSTOM PROCEDURE TRAY) ×2 IMPLANT
SET ATX-X65L (MISCELLANEOUS) IMPLANT
SHEATH PROBE COVER 6X72 (BAG) IMPLANT

## 2023-07-18 NOTE — Discharge Instructions (Signed)
May resume Xarelto this evening

## 2023-07-18 NOTE — Interval H&P Note (Signed)
 History and Physical Interval Note:  07/18/2023 7:00 AM  Kevin Castro  has presented today for surgery, with the diagnosis of VT.  The various methods of treatment have been discussed with the patient and family. After consideration of risks, benefits and other options for treatment, the patient has consented to  Procedure(s): LEFT HEART CATH AND CORONARY ANGIOGRAPHY (N/A) as a surgical intervention.  The patient's history has been reviewed, patient examined, no change in status, stable for surgery.  I have reviewed the patient's chart and labs.  Questions were answered to the patient's satisfaction.    Cath Lab Visit (complete for each Cath Lab visit)  Clinical Evaluation Leading to the Procedure:   ACS: No.  Non-ACS:    Anginal Classification: No Symptoms  Anti-ischemic medical therapy: Minimal Therapy (1 class of medications)  Non-Invasive Test Results: No non-invasive testing performed  Prior CABG: No previous CABG       Donata Fryer [facility] 07/18/2023 7:00 AM

## 2023-07-26 ENCOUNTER — Encounter: Payer: Self-pay | Admitting: Cardiovascular Disease

## 2023-07-28 DIAGNOSIS — I480 Paroxysmal atrial fibrillation: Secondary | ICD-10-CM | POA: Diagnosis not present

## 2023-07-28 DIAGNOSIS — I471 Supraventricular tachycardia, unspecified: Secondary | ICD-10-CM | POA: Diagnosis not present

## 2023-07-28 DIAGNOSIS — I4719 Other supraventricular tachycardia: Secondary | ICD-10-CM | POA: Diagnosis not present

## 2023-07-28 DIAGNOSIS — I452 Bifascicular block: Secondary | ICD-10-CM | POA: Diagnosis not present

## 2023-07-31 DIAGNOSIS — I471 Supraventricular tachycardia, unspecified: Secondary | ICD-10-CM | POA: Diagnosis not present

## 2023-07-31 DIAGNOSIS — Z45010 Encounter for checking and testing of cardiac pacemaker pulse generator [battery]: Secondary | ICD-10-CM | POA: Diagnosis not present

## 2023-07-31 DIAGNOSIS — I480 Paroxysmal atrial fibrillation: Secondary | ICD-10-CM | POA: Diagnosis not present

## 2023-07-31 DIAGNOSIS — I452 Bifascicular block: Secondary | ICD-10-CM | POA: Diagnosis not present

## 2023-08-02 DIAGNOSIS — I452 Bifascicular block: Secondary | ICD-10-CM | POA: Diagnosis not present

## 2023-08-02 DIAGNOSIS — I440 Atrioventricular block, first degree: Secondary | ICD-10-CM | POA: Diagnosis not present

## 2023-08-02 DIAGNOSIS — I491 Atrial premature depolarization: Secondary | ICD-10-CM | POA: Diagnosis not present

## 2023-08-02 DIAGNOSIS — I499 Cardiac arrhythmia, unspecified: Secondary | ICD-10-CM | POA: Diagnosis not present

## 2023-08-07 ENCOUNTER — Ambulatory Visit: Admitting: Cardiovascular Disease

## 2023-08-24 NOTE — Progress Notes (Signed)
 Remote pacemaker transmission.

## 2023-08-30 ENCOUNTER — Ambulatory Visit: Admitting: Cardiovascular Disease

## 2023-09-04 ENCOUNTER — Telehealth: Payer: Self-pay

## 2023-09-04 DIAGNOSIS — D72819 Decreased white blood cell count, unspecified: Secondary | ICD-10-CM | POA: Diagnosis not present

## 2023-09-04 DIAGNOSIS — E291 Testicular hypofunction: Secondary | ICD-10-CM | POA: Diagnosis not present

## 2023-09-04 DIAGNOSIS — I2510 Atherosclerotic heart disease of native coronary artery without angina pectoris: Secondary | ICD-10-CM | POA: Diagnosis not present

## 2023-09-04 DIAGNOSIS — Z125 Encounter for screening for malignant neoplasm of prostate: Secondary | ICD-10-CM | POA: Diagnosis not present

## 2023-09-04 DIAGNOSIS — E7800 Pure hypercholesterolemia, unspecified: Secondary | ICD-10-CM | POA: Diagnosis not present

## 2023-09-04 LAB — LAB REPORT - SCANNED: EGFR: 82

## 2023-09-04 NOTE — Telephone Encounter (Signed)
 Alert received from [employer] for VT Episode occurred (V>A). 1 VT classified episode on 09/02/23 at 09:25, 30 sec, atrial driven tacycardia at 201 bpm. 9 NSVT classified episodes and 5 SVT episodes, longest 50 sec on 08/26/23 at 10:29, available EGMs c/w atrial driven tachycardia and re-entrant tachycardia, cannot exclude VT, avg V rates 159-212 bpm; routed to clinic for SVT > 200 bpm for up to 50 sec duration.  Patient reports palpitations during events listed below. States all events occur while running. Patient has upcoming MRI 09/07/23 and f./u with Dr. Nancey 09/19/23.   Patient states when his heart rate does increase he slows down and rest and feels better. Patient advised I will forward to Dr. Nancey for review and if any changes are advised will follow up. Pt voiced understanding and appreciative of call back.

## 2023-09-05 ENCOUNTER — Encounter (HOSPITAL_COMMUNITY): Payer: Self-pay

## 2023-09-07 ENCOUNTER — Ambulatory Visit (HOSPITAL_COMMUNITY)
Admission: RE | Admit: 2023-09-07 | Discharge: 2023-09-07 | Disposition: A | Discharge status: Home / Self Care | Source: Ambulatory Visit | Attending: Cardiovascular Disease | Admitting: Cardiovascular Disease

## 2023-09-07 ENCOUNTER — Other Ambulatory Visit: Payer: Self-pay | Admitting: Cardiovascular Disease

## 2023-09-07 DIAGNOSIS — I4729 Other ventricular tachycardia: Secondary | ICD-10-CM

## 2023-09-07 DIAGNOSIS — I472 Ventricular tachycardia, unspecified: Secondary | ICD-10-CM | POA: Diagnosis not present

## 2023-09-07 DIAGNOSIS — I480 Paroxysmal atrial fibrillation: Secondary | ICD-10-CM | POA: Insufficient documentation

## 2023-09-07 DIAGNOSIS — J984 Other disorders of lung: Secondary | ICD-10-CM | POA: Diagnosis not present

## 2023-09-07 MED ORDER — GADOBUTROL 1 MMOL/ML IV SOLN
10.0000 mL | Freq: Once | INTRAVENOUS | Status: AC | PRN
  Administered 2023-09-07: 10 mL via INTRAVENOUS

## 2023-09-11 DIAGNOSIS — D6869 Other thrombophilia: Secondary | ICD-10-CM | POA: Diagnosis not present

## 2023-09-11 DIAGNOSIS — Z8546 Personal history of malignant neoplasm of prostate: Secondary | ICD-10-CM | POA: Diagnosis not present

## 2023-09-11 DIAGNOSIS — Z923 Personal history of irradiation: Secondary | ICD-10-CM | POA: Diagnosis not present

## 2023-09-11 DIAGNOSIS — I2510 Atherosclerotic heart disease of native coronary artery without angina pectoris: Secondary | ICD-10-CM | POA: Diagnosis not present

## 2023-09-11 DIAGNOSIS — D72819 Decreased white blood cell count, unspecified: Secondary | ICD-10-CM | POA: Diagnosis not present

## 2023-09-11 DIAGNOSIS — Z0000 Encounter for general adult medical examination without abnormal findings: Secondary | ICD-10-CM | POA: Diagnosis not present

## 2023-09-11 DIAGNOSIS — E291 Testicular hypofunction: Secondary | ICD-10-CM | POA: Diagnosis not present

## 2023-09-11 DIAGNOSIS — E7841 Elevated Lipoprotein(a): Secondary | ICD-10-CM | POA: Diagnosis not present

## 2023-09-11 DIAGNOSIS — B078 Other viral warts: Secondary | ICD-10-CM | POA: Diagnosis not present

## 2023-09-11 DIAGNOSIS — D649 Anemia, unspecified: Secondary | ICD-10-CM | POA: Diagnosis not present

## 2023-09-11 DIAGNOSIS — I480 Paroxysmal atrial fibrillation: Secondary | ICD-10-CM | POA: Diagnosis not present

## 2023-09-11 DIAGNOSIS — G250 Essential tremor: Secondary | ICD-10-CM | POA: Diagnosis not present

## 2023-09-12 ENCOUNTER — Ambulatory Visit: Payer: Self-pay | Admitting: Cardiovascular Disease

## 2023-09-18 ENCOUNTER — Encounter: Payer: Self-pay | Admitting: Cardiovascular Disease

## 2023-09-18 NOTE — Progress Notes (Unsigned)
 Electrophysiology Office Note:    Date:  09/19/2023   ID:  Kevin Castro, DOB Jan 23, 1946, MRN [id number]  PCP:  Kevin Nottingham, MD   [facility] Providers Cardiologist:  None Electrophysiologist:  Eulas FORBES Furbish, MD     Referring MD: Kevin Nottingham, MD   History of Present Illness:    Kevin Castro is a 78 y.o. male with a hx listed below, significant for PAF s/p PVI 2017, [employer] pacemaker referred for arrhythmia management.  Since his ablation in 2017, he has had episodes of atrial flutter. In June 22, he had syncope with what appears to be a post-conversion pause. The pacemaker was then placed. One episode of flutter was pace terminated in October 2023. Device interrogation showed episodes of atrial flutter that correlated with decreased exercise tolerance.  We discussed potential for mapping and ablation of atypical atrial flutter.  He was again seen by Dr. Fernande and continued with flecainide  pill in the pocket therapy.  He was interested in PFA and underwent a procedure by Dr. Dasie at [facility] in March 2025.  I reviewed Dr. Berenda note.  He was able to do induce an arrhythmia most typical of CTI dependent flutter though the entire cycle length of the flutter was not captured on the right atrium.  The flutter line from the prior ablation had breakthrough.  The PVI and posterior wall remained electrically isolated and multiple indicators suggested that the rhythm was not originating from the left side.  Additional ablation was also performed on the interatrial septum.  He was largely doing well since the ablation but on April 26 had recurrence of symptoms during a trail run.  Device interrogation showed frequent and sustained episodes with atrial high rates with a cycle length ranging from about 332 to 360 ms, often times with one-to-one conduction.  He did not have his flecainide  but did take additional verapamil .  There was an episode of VT detected by his  device on May 28 at approximately 6 PM.  Patient noted he had likely just tarted a run at that time.  He does not recall having any lightheadedness, dizziness, chest pain. He subsequently underwent a coronary angiogram that did not show any hemodynamically significant coronary disease, and a cardiac MRI showed normal ejection fraction with a small amount of basilar scar.  He last saw me on July 12, 2023.  He was seen by Chiquita Novak with [facility] EP on June 27.  They discussed starting a small dose of metoprolol  and performing repeat mapping and ablation, which he declined at the time.  He has had several atrial high rate events, many classified as VT by rate   (see August 4 phone note).  The atrial cycle length was about 300 ms, and there was a one-to-one AV conduction.  EKGs/Labs/Other Studies Reviewed Today:      EKG:   EKG Interpretation Date/Time:  Tuesday September 19 2023 10:34:05 EDT Ventricular Rate:  58 PR Interval:  234 QRS Duration:  144 QT Interval:  450 QTC Calculation: 441 R Axis:   -62  Text Interpretation: Sinus bradycardia with sinus arrhythmia with 1st degree A-V block Right bundle branch block Left anterior fascicular block Bifascicular block When compared with ECG of 12-Jul-2023 10:51, No significant change was found Confirmed by Furbish Eulas [phone]) on 09/19/2023 10:48:48 AM    Recent Labs: 07/12/2023: BUN 18; Creatinine, Ser 1.03; Hemoglobin 14.1; Platelets 188; Potassium 5.0; Sodium 140   Coronary angiogram: 07/18/2023 No significant CAD  Cardiac MRI: September 07, 2023 LVEF 56%.  Small area of focal mid wall delayed enhancement in the basal inferolateral segment consistent with prior myocarditis.  Normal T2 values do not suggest an acute process.  Physical Exam:    VS:  BP 114/72   Pulse (!) 58   Ht 5' 10 (1.778 m)   Wt 192 lb 6.4 oz (87.3 kg)   SpO2 95%   BMI 27.61 kg/m     Wt Readings from Last 3 Encounters:  09/19/23 192 lb 6.4 oz (87.3 kg)   07/18/23 189 lb (85.7 kg)  07/12/23 191 lb 9.6 oz (86.9 kg)     GEN: Well nourished, well developed in no acute distress CARDIAC: RRR, no murmurs, rubs, gallops RESPIRATORY:  Normal work of breathing MUSCULOSKELETAL: no edema    ASSESSMENT & PLAN:    Atrial flutter and ectopic atrial tachycardia:  He is symptomatic with fatigue, and his ventricular rates are sometimes not well controlled. Episodes typically occur during or shortly after exercise S/p mapping and ablation of CTI and the interatrial septum for ectopic atrial tach at [facility] March 2025 With recurrence of suspected atrial tachycardia and April 2025.  Device EGM's show brief tachycardia episodes with cycle length approaching 300 ms, one-to-one AV conduction with slight warm up We will suspend verapamil  and try metoprolol  25 mg p.o. twice daily he may start with half a tablet and adjust He continues to have frequent PACs   VT Device detected episodes, longest was 44 seconds on May 28 On my review, this was not a sustained episode; there were frequent runs from 4-20 beats, separated by single sinus beats He was asymptomatic Stress PET in 2022 showed reduced resting EF but no evidence of ischemia Evaluated with coronary angiogram and MRI --no significant CAD, small focal area of basal inferolateral scar He has not had symptoms consistent with ischemia, but is worth noting that the event likely occurred after starting a run I don't see that thyroid function has been checked, we'll rule out a thyroid issue  Atrial fibrillation History of AF ablation by cryoballoon in 2017 PVI remained intact per Dr. Dasie in March 2025  History of post-conversion pause:  [employer] dual chamber pacemaker:  Placed for syncope and trifascicular block in June 2022  I reviewed today's device interrogation.  See Paceart for details  normal function He is not device dependent Device is programmed to a lower rate limit of 40  bpm         Medication Adjustments/Labs and Tests Ordered: Current medicines are reviewed at length with the patient today.  Concerns regarding medicines are outlined above.  Orders Placed This Encounter  Procedures   EKG 12-Lead   No orders of the defined types were placed in this encounter.    Signed, Eulas FORBES Furbish, MD  09/19/2023 10:52 AM    [facility]

## 2023-09-19 ENCOUNTER — Ambulatory Visit: Attending: Cardiovascular Disease | Admitting: Cardiovascular Disease

## 2023-09-19 ENCOUNTER — Encounter: Payer: Self-pay | Admitting: Cardiovascular Disease

## 2023-09-19 VITALS — BP 114/72 | HR 58 | Ht 70.0 in | Wt 192.4 lb

## 2023-09-19 DIAGNOSIS — Z79899 Other long term (current) drug therapy: Secondary | ICD-10-CM | POA: Diagnosis not present

## 2023-09-19 DIAGNOSIS — I480 Paroxysmal atrial fibrillation: Secondary | ICD-10-CM

## 2023-09-19 MED ORDER — METOPROLOL TARTRATE 25 MG PO TABS: 25.0000 mg | ORAL_TABLET | Freq: Two times a day (BID) | ORAL | 3 refills | Status: DC

## 2023-09-19 NOTE — Patient Instructions (Addendum)
 Medication Instructions:  Your physician has recommended you make the following change in your medication:   ** Stop Verapamil   ** Start Metoprolol  Tartrate 25mg  - 1 tablet by mouth twice daily.  *If you need a refill on your cardiac medications before your next appointment, please call your pharmacy*  Lab Work: TSH today  If you have labs (blood work) drawn today and your tests are completely normal, you will receive your results only by: MyChart Message (if you have MyChart) OR A paper copy in the mail If you have any lab test that is abnormal or we need to change your treatment, we will call you to review the results.  Testing/Procedures: None ordered.   Follow-Up: At [facility], you and your health needs are our priority.  As part of our continuing mission to provide you with exceptional heart care, our providers are all part of one team.  This team includes your primary Cardiologist (physician) and Advanced Practice Providers or APPs (Physician Assistants and Nurse Practitioners) who all work together to provide you with the care you need, when you need it.  Your next appointment:   3 months with Dr Mealor

## 2023-09-20 ENCOUNTER — Ambulatory Visit: Payer: Self-pay

## 2023-09-20 LAB — TSH: TSH: 2.44 u[IU]/mL (ref 0.450–4.500)

## 2023-10-10 NOTE — Progress Notes (Unsigned)
  Cardiology Office Note:  .   Date:  10/10/2023  ID:  Lynwood Mody, DOB [birth year], MRN [id number] PCP: Clarice Nottingham, MD  [facility] Providers Cardiologist:  None Electrophysiologist:  Eulas FORBES Furbish, MD { Click to update primary MD,subspecialty MD or APP then REFRESH:1}   History of Present Illness: .   No chief complaint on file.   Kevin Castro is a 78 y.o. male with history of VT, pAF, CHB who presents for follow-up.      Problem List Atrial fibrillation  -PVI 2017 2. CHB s/p ppm 3. Coronary calcium -score 10 (15th percentile) -normal coronaries 09/19/2023 4. VT -CMR with prior myocarditis 09/07/2023    ROS: All other ROS reviewed and negative. Pertinent positives noted in the HPI.     Studies Reviewed: SABRA       LHC 07/18/2023 No significant CAD. The LAD is small but the ramus is very large and supplies the apex and multiple lateral wall branches. The RCA is also very large.  Normal LV function LVEDP 16 mm Hg Physical Exam:   VS:  There were no vitals taken for this visit.   Wt Readings from Last 3 Encounters:  09/19/23 192 lb 6.4 oz (87.3 kg)  07/18/23 189 lb (85.7 kg)  07/12/23 191 lb 9.6 oz (86.9 kg)    GEN: Well nourished, well developed in no acute distress NECK: No JVD; No carotid bruits CARDIAC: ***RRR, no murmurs, rubs, gallops RESPIRATORY:  Clear to auscultation without rales, wheezing or rhonchi  ABDOMEN: Soft, non-tender, non-distended EXTREMITIES:  No edema; No deformity  ASSESSMENT AND PLAN: .   ***    {Are you ordering a CV Procedure (e.g. stress test, cath, DCCV, TEE, etc)?   Press F2        :[id number]}   Follow-up: No follow-ups on file.  Time Spent with Patient: I have spent a total of *** minutes caring for this patient today face to face, ordering and reviewing labs/tests, reviewing prior records/medical history, examining the patient, establishing an assessment and plan, communicating results/findings to the patient/family, and  documenting in the medical record.   Signed, Darryle DASEN. Barbaraann, MD, [facility]  [facility]  [address] [place], [place] [address] [phone]  5:53 PM

## 2023-10-11 ENCOUNTER — Encounter: Payer: Self-pay | Admitting: Cardiovascular Disease

## 2023-10-11 ENCOUNTER — Ambulatory Visit: Attending: Cardiovascular Disease | Admitting: Cardiovascular Disease

## 2023-10-11 VITALS — BP 110/70 | HR 56 | Ht 70.0 in | Wt 192.3 lb

## 2023-10-11 DIAGNOSIS — I480 Paroxysmal atrial fibrillation: Secondary | ICD-10-CM

## 2023-10-11 DIAGNOSIS — Z950 Presence of cardiac pacemaker: Secondary | ICD-10-CM | POA: Diagnosis not present

## 2023-10-11 DIAGNOSIS — I4729 Other ventricular tachycardia: Secondary | ICD-10-CM

## 2023-10-11 MED ORDER — METOPROLOL SUCCINATE ER 25 MG PO TB24: 25.0000 mg | ORAL_TABLET | Freq: Every day | ORAL | 3 refills | Status: AC

## 2023-10-11 NOTE — Patient Instructions (Signed)
 Medication Instructions:  Your physician has recommended you make the following change in your medication:  STOP: metoprolol  tartrate START: metoprolol  succinate (Toprol ) 25 mg by mouth once daily  *If you need a refill on your cardiac medications before your next appointment, please call your pharmacy*  Lab Work: NONE  If you have labs (blood work) drawn today and your tests are completely normal, you will receive your results only by: MyChart Message (if you have MyChart) OR A paper copy in the mail If you have any lab test that is abnormal or we need to change your treatment, we will call you to review the results.  Testing/Procedures: NONE  Follow-Up: At [facility], you and your health needs are our priority.  As part of our continuing mission to provide you with exceptional heart care, our providers are all part of one team.  This team includes your primary Cardiologist (physician) and Advanced Practice Providers or APPs (Physician Assistants and Nurse Practitioners) who all work together to provide you with the care you need, when you need it.  Your next appointment:   1 year(s)  Provider:   One of our Advanced Practice Providers (APPs): Morse Clause, PA-C  Lamarr Satterfield, NP Miriam Shams, NP  Olivia Pavy, PA-C Josefa Beauvais, NP  Leontine Salen, PA-C Orren Fabry, PA-C  [place], [place] Jackee Alberts, NP  Damien Braver, NP Jon Hails, PA-C  Waddell Donath, PA-C    Dayna Dunn, PA-C  Glendia Ferrier, PA-C Lum Louis, NP Katlyn West, NP Callie Goodrich, PA-C  Evan Williams, PA-C Sheng Haley, PA-C  Xika Zhao, NP Kathleen Johnson, PA-C

## 2023-10-16 ENCOUNTER — Ambulatory Visit (INDEPENDENT_AMBULATORY_CARE_PROVIDER_SITE_OTHER): Payer: Medicare HMO

## 2023-10-16 DIAGNOSIS — I4729 Other ventricular tachycardia: Secondary | ICD-10-CM

## 2023-10-18 LAB — CUP PACEART REMOTE DEVICE CHECK
Battery Remaining Longevity: 144 mo
Battery Remaining Percentage: 100 %
Brady Statistic RA Percent Paced: 2 %
Brady Statistic RV Percent Paced: 0 %
Date Time Interrogation Session: 20250915041100
Implantable Lead Connection Status: 753985
Implantable Lead Connection Status: 753985
Implantable Lead Implant Date: 20220613
Implantable Lead Implant Date: 20220613
Implantable Lead Location: 753859
Implantable Lead Location: 753860
Implantable Lead Model: 7841
Implantable Lead Model: 7842
Implantable Lead Serial Number: 1074164
Implantable Lead Serial Number: 1156308
Implantable Pulse Generator Implant Date: 20220613
Lead Channel Impedance Value: 513 Ohm
Lead Channel Impedance Value: 591 Ohm
Lead Channel Pacing Threshold Amplitude: 0.5 V
Lead Channel Pacing Threshold Amplitude: 0.8 V
Lead Channel Pacing Threshold Pulse Width: 0.4 ms
Lead Channel Pacing Threshold Pulse Width: 0.4 ms
Lead Channel Setting Pacing Amplitude: 2 V
Lead Channel Setting Pacing Amplitude: 2 V
Lead Channel Setting Pacing Pulse Width: 0.4 ms
Lead Channel Setting Sensing Sensitivity: 2.5 mV
Pulse Gen Serial Number: 994472
Zone Setting Status: 755011

## 2023-10-21 ENCOUNTER — Ambulatory Visit: Payer: Self-pay | Admitting: Cardiovascular Disease

## 2023-10-23 NOTE — Progress Notes (Signed)
 Remote PPM Transmission

## 2023-10-25 DIAGNOSIS — D3132 Benign neoplasm of left choroid: Secondary | ICD-10-CM | POA: Diagnosis not present

## 2023-10-25 DIAGNOSIS — Z961 Presence of intraocular lens: Secondary | ICD-10-CM | POA: Diagnosis not present

## 2023-10-25 DIAGNOSIS — H40013 Open angle with borderline findings, low risk, bilateral: Secondary | ICD-10-CM | POA: Diagnosis not present

## 2023-11-17 DIAGNOSIS — M6289 Other specified disorders of muscle: Secondary | ICD-10-CM | POA: Diagnosis not present

## 2023-11-17 DIAGNOSIS — M79671 Pain in right foot: Secondary | ICD-10-CM | POA: Diagnosis not present

## 2023-12-11 DIAGNOSIS — E291 Testicular hypofunction: Secondary | ICD-10-CM | POA: Diagnosis not present

## 2023-12-11 DIAGNOSIS — C61 Malignant neoplasm of prostate: Secondary | ICD-10-CM | POA: Diagnosis not present

## 2023-12-12 ENCOUNTER — Other Ambulatory Visit: Payer: Self-pay | Admitting: Cardiovascular Disease

## 2023-12-12 DIAGNOSIS — I480 Paroxysmal atrial fibrillation: Secondary | ICD-10-CM

## 2023-12-12 NOTE — Telephone Encounter (Signed)
 Prescription refill request for Xarelto  received.  Indication:afib Last office visit:9/25 Weight:87.2  kg Age:78 Scr:1.03  6/25 CrCl:75.25  ml/min  Prescription refilled

## 2023-12-15 DIAGNOSIS — M722 Plantar fascial fibromatosis: Secondary | ICD-10-CM | POA: Diagnosis not present

## 2023-12-19 DIAGNOSIS — M722 Plantar fascial fibromatosis: Secondary | ICD-10-CM | POA: Diagnosis not present

## 2023-12-20 ENCOUNTER — Ambulatory Visit: Attending: Cardiovascular Disease | Admitting: Cardiovascular Disease

## 2023-12-20 VITALS — BP 138/74 | HR 60 | Ht 70.0 in | Wt 198.7 lb

## 2023-12-20 DIAGNOSIS — I480 Paroxysmal atrial fibrillation: Secondary | ICD-10-CM | POA: Diagnosis not present

## 2023-12-20 LAB — CUP PACEART INCLINIC DEVICE CHECK
Date Time Interrogation Session: 20251119092453
Implantable Lead Connection Status: 753985
Implantable Lead Connection Status: 753985
Implantable Lead Implant Date: 20220613
Implantable Lead Implant Date: 20220613
Implantable Lead Location: 753859
Implantable Lead Location: 753860
Implantable Lead Model: 7841
Implantable Lead Model: 7842
Implantable Lead Serial Number: 1074164
Implantable Lead Serial Number: 1156308
Implantable Pulse Generator Implant Date: 20220613
Lead Channel Setting Pacing Amplitude: 2 V
Lead Channel Setting Pacing Amplitude: 2 V
Lead Channel Setting Pacing Pulse Width: 0.4 ms
Lead Channel Setting Sensing Sensitivity: 2.5 mV
Pulse Gen Serial Number: 994472
Zone Setting Status: 755011

## 2023-12-20 NOTE — Progress Notes (Signed)
 Electrophysiology Office Note:    Date:  12/20/2023   ID:  Kevin Castro, DOB [birth year], MRN [id number]  PCP:  Clarice Nottingham, MD   [facility] Providers Cardiologist:  None Electrophysiologist:  Eulas FORBES Furbish, MD     Referring MD: Clarice Nottingham, MD   History of Present Illness:    Kevin Castro is a 78 y.o. male with a hx listed below, significant for PAF s/p PVI 2017, [employer] pacemaker referred for arrhythmia management.  Since his ablation in 2017, he has had episodes of atrial flutter. In June 22, he had syncope with what appears to be a post-conversion pause. The pacemaker was then placed. One episode of flutter was pace terminated in October 2023. Device interrogation showed episodes of atrial flutter that correlated with decreased exercise tolerance.  We discussed potential for mapping and ablation of atypical atrial flutter.  He was again seen by Dr. Fernande and continued with flecainide  pill in the pocket therapy.  He was interested in PFA and underwent a procedure by Dr. Dasie at [facility] in March 2025.  I reviewed Dr. Berenda note.  He was able to do induce an arrhythmia most typical of CTI dependent flutter though the entire cycle length of the flutter was not captured on the right atrium.  The flutter line from the prior ablation had breakthrough.  The PVI and posterior wall remained electrically isolated and multiple indicators suggested that the rhythm was not originating from the left side.  Additional ablation was also performed on the interatrial septum.  He was largely doing well since the ablation but on April 26 had recurrence of symptoms during a trail run.  Device interrogation showed frequent and sustained episodes with atrial high rates with a cycle length ranging from about 332 to 360 ms, often times with one-to-one conduction.  He did not have his flecainide  but did take additional verapamil .  There was an episode of VT detected by his  device on May 28 at approximately 6 PM.  Patient noted he had likely just tarted a run at that time.  He does not recall having any lightheadedness, dizziness, chest pain. He subsequently underwent a coronary angiogram that did not show any hemodynamically significant coronary disease, and a cardiac MRI showed normal ejection fraction with a small amount of basilar scar.  He last saw me on July 12, 2023.  He was seen by Chiquita Novak with [facility] EP on June 27.  They discussed starting a small dose of metoprolol  and performing repeat mapping and ablation, which he declined at the time.  He has had several atrial high rate events, many classified as VT by rate   (see August 4 phone note).  The atrial cycle length was about 300 ms, and there was a one-to-one AV conduction.  EKGs/Labs/Other Studies Reviewed Today:      EKG:   EKG Interpretation Date/Time:  Wednesday December 20 2023 09:33:20 EST Ventricular Rate:  61 PR Interval:  242 QRS Duration:  142 QT Interval:  444 QTC Calculation: 446 R Axis:   -59  Text Interpretation: Sinus rhythm with 1st degree A-V block with Premature atrial complexes Right bundle branch block Left anterior fascicular block Bifascicular block When compared with ECG of 19-Sep-2023 10:34, Premature atrial complexes are now Present Confirmed by Furbish Eulas [phone]) on 12/20/2023 9:38:55 AM    Recent Labs: 07/12/2023: BUN 18; Creatinine, Ser 1.03; Hemoglobin 14.1; Platelets 188; Potassium 5.0; Sodium 140 09/19/2023: TSH 2.440   Coronary angiogram:  07/18/2023 No significant CAD  Cardiac MRI: September 07, 2023 LVEF 56%.  Small area of focal mid wall delayed enhancement in the basal inferolateral segment consistent with prior myocarditis.  Normal T2 values do not suggest an acute process.  Physical Exam:    VS:  BP 138/74   Pulse 60   Ht 5' 10 (1.778 m)   Wt 198 lb 11.2 oz (90.1 kg)   SpO2 97%   BMI 28.51 kg/m     Wt Readings from Last 3 Encounters:   12/20/23 198 lb 11.2 oz (90.1 kg)  10/11/23 192 lb 4.8 oz (87.2 kg)  09/19/23 192 lb 6.4 oz (87.3 kg)     GEN: Well nourished, well developed in no acute distress CARDIAC: RRR, no murmurs, rubs, gallops RESPIRATORY:  Normal work of breathing MUSCULOSKELETAL: no edema    ASSESSMENT & PLAN:    Atrial flutter and ectopic atrial tachycardia:  He is symptomatic with fatigue, and his ventricular rates are sometimes not well controlled. Episodes typically occur during or shortly after exercise S/p mapping and ablation of CTI and the interatrial septum for ectopic atrial tach at [facility] March 2025 With recurrence of suspected atrial tachycardia and April 2025.  Device EGM's show brief tachycardia episodes with cycle length approaching 300 ms, one-to-one AV conduction with slight warm up Doing very well now on metoprolol , daily dose 25 mg He continues to have frequent PACs   VT Device detected episodes, longest was 44 seconds on May 28 On my review, this was not a sustained episode; there were frequent runs from 4-20 beats, separated by single sinus beats He was asymptomatic Stress PET in 2022 showed reduced resting EF but no evidence of ischemia Evaluated with coronary angiogram and MRI --no significant CAD, small focal area of basal inferolateral scar He has not had symptoms consistent with ischemia, but is worth noting that the event likely occurred after starting a run TSH normal  Atrial fibrillation History of AF ablation by cryoballoon in 2017 PVI remained intact per Dr. Dasie in March 2025 No atrial fibrillation detected by his device CHA2DS2-VASc score is 2 for age Given that he seems to be atrial fibrillation and flutter free, and frequently participates in activity with increased risk of bleeding (road cycling) I think the risk of anticoagulation outweighs the benefit.  We will adjust his monitoring parameters and Latitude to notify us  for A-fib episodes greater than 3  hours for which we may need to resume anticoagulation.  History of post-conversion pause:  [employer] dual chamber pacemaker:  Placed for syncope and trifascicular block in June 2022  I reviewed today's device interrogation.  See Paceart for details  normal function He is not device dependent Device is programmed to a lower rate limit of 40 bpm         Medication Adjustments/Labs and Tests Ordered: Current medicines are reviewed at length with the patient today.  Concerns regarding medicines are outlined above.  Orders Placed This Encounter  Procedures   CUP PACEART INCLINIC DEVICE CHECK   EKG 12-Lead   No orders of the defined types were placed in this encounter.    Signed, Eulas FORBES Furbish, MD  12/20/2023 9:46 AM    [facility]

## 2023-12-20 NOTE — Patient Instructions (Addendum)
 Medication Instructions:  Your physician has recommended you make the following change in your medication:   ** Stop Xarelto   *If you need a refill on your cardiac medications before your next appointment, please call your pharmacy*  Lab Work: None ordered.  If you have labs (blood work) drawn today and your tests are completely normal, you will receive your results only by: MyChart Message (if you have MyChart) OR A paper copy in the mail If you have any lab test that is abnormal or we need to change your treatment, we will call you to review the results.  Testing/Procedures: None ordered.   Follow-Up: At [facility], you and your health needs are our priority.  As part of our continuing mission to provide you with exceptional heart care, our providers are all part of one team.  This team includes your primary Cardiologist (physician) and Advanced Practice Providers or APPs (Physician Assistants and Nurse Practitioners) who all work together to provide you with the care you need, when you need it.  Your next appointment:   12 months with Dr Mealor

## 2023-12-20 NOTE — Addendum Note (Signed)
 Addended by: CASIMIR ALDONA BRAVO on: 12/20/2023 09:57 AM   Modules accepted: Orders

## 2024-01-01 DIAGNOSIS — M722 Plantar fascial fibromatosis: Secondary | ICD-10-CM | POA: Diagnosis not present

## 2024-01-15 ENCOUNTER — Ambulatory Visit: Payer: Medicare HMO

## 2024-01-15 DIAGNOSIS — I480 Paroxysmal atrial fibrillation: Secondary | ICD-10-CM

## 2024-01-16 LAB — CUP PACEART REMOTE DEVICE CHECK
Battery Remaining Longevity: 150 mo
Battery Remaining Percentage: 100 %
Brady Statistic RA Percent Paced: 1 %
Brady Statistic RV Percent Paced: 0 %
Date Time Interrogation Session: 20251215041100
Implantable Lead Connection Status: 753985
Implantable Lead Connection Status: 753985
Implantable Lead Implant Date: 20220613
Implantable Lead Implant Date: 20220613
Implantable Lead Location: 753859
Implantable Lead Location: 753860
Implantable Lead Model: 7841
Implantable Lead Model: 7842
Implantable Lead Serial Number: 1074164
Implantable Lead Serial Number: 1156308
Implantable Pulse Generator Implant Date: 20220613
Lead Channel Impedance Value: 529 Ohm
Lead Channel Impedance Value: 588 Ohm
Lead Channel Pacing Threshold Amplitude: 0.6 V
Lead Channel Pacing Threshold Amplitude: 0.8 V
Lead Channel Pacing Threshold Pulse Width: 0.4 ms
Lead Channel Pacing Threshold Pulse Width: 0.4 ms
Lead Channel Setting Pacing Amplitude: 2 V
Lead Channel Setting Pacing Amplitude: 2 V
Lead Channel Setting Pacing Pulse Width: 0.4 ms
Lead Channel Setting Sensing Sensitivity: 2.5 mV
Pulse Gen Serial Number: 994472
Zone Setting Status: 755011

## 2024-01-19 NOTE — Progress Notes (Signed)
 Remote PPM Transmission

## 2024-01-31 ENCOUNTER — Ambulatory Visit: Payer: Self-pay | Admitting: Cardiovascular Disease
